# Patient Record
Sex: Male | Born: 1960 | Race: White | Hispanic: No | Marital: Married | Smoking: Never smoker
Health system: Southern US, Community
[De-identification: ages and names within clinical notes are randomized; demographics above are authoritative.]

## PROBLEM LIST (undated history)

## (undated) DIAGNOSIS — E785 Hyperlipidemia, unspecified: Secondary | ICD-10-CM

## (undated) DIAGNOSIS — I10 Essential (primary) hypertension: Secondary | ICD-10-CM

## (undated) HISTORY — PX: ROTATOR CUFF REPAIR: SHX139

## (undated) HISTORY — PX: CHOLECYSTECTOMY: SHX55

---

## 2004-06-20 ENCOUNTER — Ambulatory Visit: Payer: Self-pay | Admitting: Internal Medicine

## 2005-07-13 ENCOUNTER — Ambulatory Visit: Payer: Self-pay | Admitting: Internal Medicine

## 2005-07-14 ENCOUNTER — Ambulatory Visit: Payer: Self-pay | Admitting: Internal Medicine

## 2005-08-03 ENCOUNTER — Encounter: Admission: RE | Admit: 2005-08-03 | Discharge: 2005-09-20 | Payer: Self-pay | Admitting: Internal Medicine

## 2005-08-10 ENCOUNTER — Ambulatory Visit: Payer: Self-pay | Admitting: Gastroenterology

## 2005-09-08 ENCOUNTER — Ambulatory Visit: Payer: Self-pay | Admitting: Gastroenterology

## 2007-05-21 ENCOUNTER — Ambulatory Visit: Payer: Self-pay | Admitting: Internal Medicine

## 2007-05-21 DIAGNOSIS — E8881 Metabolic syndrome: Secondary | ICD-10-CM

## 2007-05-21 DIAGNOSIS — E785 Hyperlipidemia, unspecified: Secondary | ICD-10-CM

## 2007-05-22 ENCOUNTER — Ambulatory Visit: Payer: Self-pay | Admitting: Internal Medicine

## 2007-06-02 LAB — CONVERTED CEMR LAB
ALT: 29 units/L (ref 0–53)
Bilirubin, Direct: 0.1 mg/dL (ref 0.0–0.3)
Calcium: 9.4 mg/dL (ref 8.4–10.5)
Cholesterol: 152 mg/dL (ref 0–200)
Creatinine,U: 161.4 mg/dL
Eosinophils Absolute: 0.1 10*3/uL (ref 0.0–0.6)
Eosinophils Relative: 2 % (ref 0.0–5.0)
GFR calc Af Amer: 116 mL/min
GFR calc non Af Amer: 96 mL/min
Glucose, Bld: 105 mg/dL — ABNORMAL HIGH (ref 70–99)
HDL: 26 mg/dL — ABNORMAL LOW (ref >39.0)
Hgb A1c MFr Bld: 5.5 % (ref 4.6–6.0)
Lymphocytes Relative: 34.3 % (ref 12.0–46.0)
MCHC: 34.8 g/dL (ref 30.0–36.0)
MCV: 90 fL (ref 78.0–100.0)
Microalb Creat Ratio: 3.1 mg/g (ref 0.0–30.0)
Monocytes Relative: 9.2 % (ref 3.0–11.0)
Neutro Abs: 3 10*3/uL (ref 1.4–7.7)
Platelets: 316 10*3/uL (ref 150–400)
Sodium: 143 meq/L (ref 135–145)
Triglycerides: 170 mg/dL — ABNORMAL HIGH (ref 0–149)
WBC: 5.7 10*3/uL (ref 4.5–10.5)

## 2007-06-04 ENCOUNTER — Encounter (INDEPENDENT_AMBULATORY_CARE_PROVIDER_SITE_OTHER): Payer: Self-pay | Admitting: *Deleted

## 2009-09-21 ENCOUNTER — Ambulatory Visit: Payer: Self-pay | Admitting: Occupational Medicine

## 2010-06-08 ENCOUNTER — Ambulatory Visit: Payer: Self-pay | Admitting: Internal Medicine

## 2010-06-08 ENCOUNTER — Encounter: Payer: Self-pay | Admitting: Internal Medicine

## 2010-06-08 DIAGNOSIS — R5381 Other malaise: Secondary | ICD-10-CM | POA: Insufficient documentation

## 2010-06-08 DIAGNOSIS — R6882 Decreased libido: Secondary | ICD-10-CM | POA: Insufficient documentation

## 2010-06-08 DIAGNOSIS — I868 Varicose veins of other specified sites: Secondary | ICD-10-CM

## 2010-06-08 DIAGNOSIS — R21 Rash and other nonspecific skin eruption: Secondary | ICD-10-CM

## 2010-06-08 DIAGNOSIS — R5383 Other fatigue: Secondary | ICD-10-CM

## 2010-06-08 DIAGNOSIS — Z9189 Other specified personal risk factors, not elsewhere classified: Secondary | ICD-10-CM | POA: Insufficient documentation

## 2010-06-14 ENCOUNTER — Ambulatory Visit: Payer: Self-pay | Admitting: Internal Medicine

## 2010-06-20 LAB — CONVERTED CEMR LAB
AST: 23 units/L (ref 0–37)
Albumin: 3.7 g/dL (ref 3.5–5.2)
Alkaline Phosphatase: 44 units/L (ref 39–117)
Basophils Relative: 0.5 % (ref 0.0–3.0)
CO2: 30 meq/L (ref 19–32)
Chloride: 106 meq/L (ref 96–112)
Eosinophils Relative: 0 % (ref 0.0–5.0)
Glucose, Bld: 100 mg/dL — ABNORMAL HIGH (ref 70–99)
HCT: 41.9 % (ref 39.0–52.0)
Hgb A1c MFr Bld: 5.8 % (ref 4.6–6.5)
Ketones, ur: NEGATIVE mg/dL
Leukocytes, UA: NEGATIVE
Lymphs Abs: 2.1 10*3/uL (ref 0.7–4.0)
MCV: 92.8 fL (ref 78.0–100.0)
Monocytes Absolute: 0.5 10*3/uL (ref 0.1–1.0)
Monocytes Relative: 8.4 % (ref 3.0–12.0)
Neutrophils Relative %: 56.7 % (ref 43.0–77.0)
Nitrite: NEGATIVE
PSA: 2.1 ng/mL (ref 0.10–4.00)
Potassium: 4.8 meq/L (ref 3.5–5.1)
RBC: 4.52 M/uL (ref 4.22–5.81)
Sodium: 141 meq/L (ref 135–145)
Specific Gravity, Urine: 1.025 (ref 1.000–1.030)
Total Protein: 6.5 g/dL (ref 6.0–8.3)
WBC: 6.2 10*3/uL (ref 4.5–10.5)
pH: 5.5 (ref 5.0–8.0)

## 2010-06-21 ENCOUNTER — Telehealth: Payer: Self-pay | Admitting: Internal Medicine

## 2010-06-24 ENCOUNTER — Ambulatory Visit: Payer: Self-pay | Admitting: Internal Medicine

## 2010-06-27 LAB — CONVERTED CEMR LAB
Bilirubin Urine: NEGATIVE
Hemoglobin, Urine: NEGATIVE
Leukocytes, UA: NEGATIVE
Nitrite: NEGATIVE
Total Protein, Urine: NEGATIVE mg/dL
Urobilinogen, UA: 0.2 (ref 0.0–1.0)

## 2010-06-29 ENCOUNTER — Encounter: Payer: Self-pay | Admitting: Internal Medicine

## 2010-07-04 LAB — CONVERTED CEMR LAB: Testosterone: 193.93 ng/dL — ABNORMAL LOW (ref 250–890)

## 2010-09-06 NOTE — Progress Notes (Signed)
Summary: needs more explanation about labs  Phone Note Call from Patient Call back at Work Phone [phone] Call back at cell = [phone]   Caller: Spouse Summary of Call: called [phone]  to schedule fasting 8am lab per Dr Frederik Pear request (see note 11/14)---that is his home number---wife would like a nurse to call spouse at work number or cell and explain why another lab needs to be taken since he was fasting when the first labs wer taken; also wants nurse to explain numbers from first labs from 11/8; then he can make appointment Initial call taken by: Jerolyn Shin,  June 21, 2010 10:26 AM  Follow-up for Phone Call        Left message on voicemail to call back to office. Lucious Groves CMA  June 21, 2010 10:33 AM   Patient notified. Lucious Groves CMA  June 21, 2010 10:44 AM

## 2010-09-06 NOTE — Assessment & Plan Note (Signed)
Summary: STOMACH PAIN,VOMITTING/WB   Vital Signs:  Patient Profile:   50 Years Old Male CC:      Abdominal pain, blotting, vomiitng, diarrhea since 2 am this morning Height:     71 inches Weight:      265 pounds O2 Sat:      99 % O2 treatment:    Room Air Temp:     97.4 degrees F oral Pulse rate:   72 / minute Pulse rhythm:   regular Resp:     16 per minute BP sitting:   128 / 83  (right arm) Cuff size:   regular  Vitals Entered By: Emilio Math (September 21, 2009 8:39 AM)                  Current Allergies (reviewed today): No known allergies History of Present Illness Chief Complaint: Abdominal pain, blotting, vomiitng, diarrhea since 2 am this morning History of Present Illness: Presents with complaints of nausea, vomiting, and diarrhea since early this am.  No reports of fever.   No  other sick contacts.    Current Meds MULTIVITAMINS   TABS (MULTIPLE VITAMIN) 1 tablet by mouth daily FISH OIL   OIL (FISH OIL) 4 capsules by mouth daily ADULT ASPIRIN EC LOW STRENGTH 81 MG  TBEC (ASPIRIN) 1 tablet by mouth daily PRAVACHOL 40 MG  TABS (PRAVASTATIN SODIUM) 1 qhs ZOFRAN 4 MG TABS (ONDANSETRON HCL) 1 by mouth every 8 hours as needed for nausea  REVIEW OF SYSTEMS Constitutional Symptoms      Denies fever, chills, night sweats, weight loss, weight gain, and fatigue.  Eyes       Complains of glasses.      Denies change in vision, eye pain, eye discharge, contact lenses, and eye surgery. Ear/Nose/Throat/Mouth       Denies hearing loss/aids, change in hearing, ear pain, ear discharge, dizziness, frequent runny nose, frequent nose bleeds, sinus problems, sore throat, hoarseness, and tooth pain or bleeding.  Respiratory       Denies dry cough, productive cough, wheezing, shortness of breath, asthma, bronchitis, and emphysema/COPD.  Cardiovascular       Denies murmurs, chest pain, and tires easily with exhertion.    Gastrointestinal       Complains of stomach pain,  nausea/vomiting, and diarrhea.      Denies constipation, blood in bowel movements, and indigestion. Genitourniary       Denies painful urination, kidney stones, and loss of urinary control. Neurological       Denies paralysis, seizures, and fainting/blackouts. Musculoskeletal       Complains of muscle pain and joint pain.      Denies joint stiffness, decreased range of motion, redness, swelling, muscle weakness, and gout.  Skin       Denies bruising, unusual mles/lumps or sores, and hair/skin or nail changes.  Psych       Denies mood changes, temper/anger issues, anxiety/stress, speech problems, depression, and sleep problems.  Past History:  Past Medical History: Reviewed history from 05/21/2007 and no changes required. Borderline HTN Hyperlipidemia  Past Surgical History: Reviewed history from 05/21/2007 and no changes required. Colonoscopy 2007 int  hemorrhoids  Family History: Reviewed history from 05/21/2007 and no changes required. Family History Diabetes 1st degree relative Family History Hypertension Father: cns aneurysm Mother: uterine CA,DM,HTN, polyps Siblings: neg  Social History: Non smoker No ETOH No DRugs [employer] Physical Exam General appearance: well developed, well nourished, no acute distress Thyroid: no nodules, masses, tenderness,  or enlargement Chest/Lungs: no rales, wheezes, or rhonchi bilateral, breath sounds equal without effort Heart: regular rate and  rhythm, no murmur Abdomen: positive epigastric tenderness, abdomen soft without obvious organomegaly Assessment New Problems: GASTROENTERITIS (ICD-558.9)   Plan New Medications/Changes: ZOFRAN 4 MG TABS (ONDANSETRON HCL) 1 by mouth every 8 hours as needed for nausea  #15 x 0, 09/21/2009, Kathrine Haddock MD  New Orders: Est. Patient Level II [phone]  The patient and/or caregiver has been counseled thoroughly with regard to medications prescribed including dosage, schedule,  interactions, rationale for use, and possible side effects and they verbalize understanding.  Diagnoses and expected course of recovery discussed and will return if not improved as expected or if the condition worsens. Patient and/or caregiver verbalized understanding.  Prescriptions: ZOFRAN 4 MG TABS (ONDANSETRON HCL) 1 by mouth every 8 hours as needed for nausea  #15 x 0   Entered and Authorized by:   Kathrine Haddock MD   Signed by:   Kathrine Haddock MD on 09/21/2009   Method used:   Print then Give to Patient   RxID:   [id number]   Patient Instructions: 1)  teh main problem with gastroenteritis is dehydration. Drink plenty of fluids and take solids as you feel better. If you are unable to keep anything down and/or you show signs of dehydration(dry/cracked lips, lack of tears, not urinating, very sleepy), call our office. 2)  Zofran as needed for nausea

## 2010-09-06 NOTE — Assessment & Plan Note (Signed)
Summary: CPX,DISCUSS CHOLESTEROL,UHC/RH......   Vital Signs:  Patient profile:   50 year old male Height:      71 inches Weight:      279.4 pounds BMI:     39.11 Temp:     97.7 degrees F oral Pulse rate:   76 / minute Resp:     16 per minute BP sitting:   122 / 84  (left arm) Cuff size:   large  Vitals Entered By: Shonna Chock CMA (June 08, 2010 3:23 PM)   History of Present Illness: Timothy Wiggins is here for a physical; he has had some intermittent fatigue.  Lipid Management History:      Positive NCEP/ATP III risk factors include male age 67 years old or older and HDL cholesterol less than 40.  Negative NCEP/ATP III risk factors include non-diabetic, no family history for ischemic heart disease, non-tobacco-user status, non-hypertensive, no ASHD (atherosclerotic heart disease), no prior stroke/TIA, no peripheral vascular disease, and no history of aortic aneurysm.     Preventive Screening-Counseling & Management  Caffeine-Diet-Exercise     Does Patient Exercise: no  Current Medications (verified): 1)  Multivitamins   Tabs (Multiple Vitamin) .Marland Kitchen.. 1 Tablet By Mouth Daily 2)  Fish Oil   Oil (Fish Oil) .... 4 Capsules By Mouth Daily 3)  Adult Aspirin Ec Low Strength 81 Mg  Tbec (Aspirin) .Marland Kitchen.. 1 Tablet By Mouth Daily  Allergies (verified): No Known Drug Allergies  Past History:  Past Medical History: Elevated BP w/o diagnosis of  HTN Hyperlipidemia: Framingham Study LDL goal = < 130. 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         DOE only with stairs. Intermittent bitemporal headaches; NSAIDS releve these. Snoring  w/o history of apnea. Some daytime somnulence .No am headaches . Some decreased libido & suboptimal erections.  Physical Exam  General:  in no acute distress; alert,appropriate and cooperative throughout examination Head:  Normocephalic and atraumatic without obvious abnormalities. No apparent alopecia ; moustache Eyes:  No corneal or conjunctival inflammation noted. Marland Kitchen Perrla. Funduscopic exam benign, without hemorrhages, exudates or papilledema.  Ears:  External ear exam shows no significant lesions or deformities.  Otoscopic examination reveals clear canals, tympanic membranes are intact bilaterally without bulging, retraction, inflammation or discharge. Hearing is grossly normal bilaterally. Nose:  External nasal examination shows no deformity or inflammation. Nasal mucosa are pink and moist without lesions or exudates. Mouth:  Oral mucosa and oropharynx without lesions or exudates.  Teeth in good repair. Oropharyngeal marked crowding Neck:  No deformities, masses, or tenderness noted. Lungs:  Normal respiratory effort, chest expands symmetrically. Lungs are clear to auscultation, no crackles or wheezes. Heart:  Normal rate and regular rhythm. S1 and S2 normal without gallop, murmur, click, rub .S 4 Abdomen:  Bowel sounds positive,abdomen soft and non-tender without masses, organomegaly or hernias  noted. Rectal:  No external abnormalities noted. Normal sphincter tone. No rectal masses or tenderness. Genitalia:  Testes bilaterally descended without nodularity, tenderness or masses.  No scrotal masses or lesions. No penis lesions or urethral discharge. Prostate:  Prostate gland firm and smooth, no enlargement, nodularity, tenderness, mass, asymmetry or induration. Msk:  No deformity or scoliosis noted of thoracic or lumbar spine.   Pulses:  R and L carotid,radial,dorsalis pedis and posterior tibial pulses are full and equal bilaterally Extremities:  No clubbing, cyanosis, edema, or deformity noted with normal full range of motion of all joints.   Neurologic:  alert & oriented X3 and DTRs symmetrical and normal.   Skin:  Intact without suspicious lesions. Irregular reddish plaques over RLE , predominantly around large varicosity  Cervical Nodes:  No lymphadenopathy noted Axillary Nodes:  No palpable lymphadenopathy Psych:  memory intact for recent and remote, normally interactive, and good eye contact.     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 1 qhs  Problem # 6:  VARICOSE VEIN (ICD-456.8)  Problem # 7:  LIBIDO, DECREASED (ICD-799.81)  Problem # 8:  SNORING, HX OF (ICD-V15.89)  Orders: Sleep Disorder Referral (Sleep Disorder)  Complete Medication List: 1)  Multivitamins Tabs (Multiple vitamin) .Marland Kitchen.. 1 tablet by mouth daily 2)  Fish Oil Oil (Fish oil) .... 4 capsules by mouth daily 3)  Adult Aspirin Ec Low Strength 81 Mg Tbec (Aspirin) .Marland Kitchen.. 1 tablet by mouth daily  Other Orders: Tdap => 20yrs IM (04540) Admin 1st Vaccine (98119)  Lipid Assessment/Plan:      Based on  NCEP/ATP III, the patient's risk factor category is "2 or more risk factors and a calculated 10 year CAD risk of < 20%".  The patient's lipid goals are as follows: Total cholesterol goal is 200; LDL cholesterol goal is 130; HDL cholesterol goal is 40; Triglyceride goal is 150.  His LDL cholesterol goal has been met.    Patient Instructions: 1)  Consume as little HFCS sugar/ day as possible ( @ least < 40 grams/ day) .Consider The New Sugar Busters. Schedue fasting labs:Testosterone level;free T4;BMP Hepatic Panel ;Lipid Panel ;TSH ;CBC w/ Diff ;Urine-dip ;PSA ;HbgA1C . See Diagnoses for Codes.   Orders Added: 1)  Tdap => 35yrs IM [90715] 2)  Admin 1st Vaccine [90471] 3)  Est. Patient 40-64 years [99396] 4)  EKG w/ Interpretation [93000] 5)  Sleep Disorder Referral [Sleep Disorder]   Immunizations Administered:  Tetanus Vaccine:    Vaccine Type: Tdap    Site: right deltoid    Mfr: GlaxoSmithKline    Dose: 0.5 ml    Route: IM    Given by: Shonna Chock CMA    Exp. Date: 05/26/2012    Lot #: [id number]    VIS given: 06/24/08 version given June 08, 2010.   Immunizations Administered:  Tetanus Vaccine:    Vaccine Type: Tdap    Site: right deltoid    Mfr: GlaxoSmithKline    Dose: 0.5 ml    Route: IM    Given by: Chrae  Malloy CMA    Exp. Date: 05/26/2012    Lot #: [id number]    VIS given: 06/24/08 version given June 08, 2010.

## 2010-11-03 ENCOUNTER — Encounter: Payer: Self-pay | Admitting: Emergency Medicine

## 2010-11-03 ENCOUNTER — Inpatient Hospital Stay (INDEPENDENT_AMBULATORY_CARE_PROVIDER_SITE_OTHER)
Admission: RE | Admit: 2010-11-03 | Discharge: 2010-11-03 | Disposition: A | Discharge status: Home / Self Care | Payer: 59 | Source: Ambulatory Visit | Attending: Emergency Medicine | Admitting: Emergency Medicine

## 2010-11-03 DIAGNOSIS — M25569 Pain in unspecified knee: Secondary | ICD-10-CM | POA: Insufficient documentation

## 2010-11-04 ENCOUNTER — Telehealth (INDEPENDENT_AMBULATORY_CARE_PROVIDER_SITE_OTHER): Payer: Self-pay | Admitting: *Deleted

## 2010-11-08 NOTE — Assessment & Plan Note (Signed)
Summary: PAIN & STIFFNESS IN KNEES/TJ (rm 2)   Vital Signs:  Patient Profile:   50 Years Old Male CC:      knee pain x 1 week Height:     71 inches Weight:      284 pounds O2 Sat:      97 % O2 treatment:    Room Air Temp:     99.2 degrees F oral Pulse rate:   71 / minute Resp:     16 per minute BP sitting:   149 / 88  (left arm) Cuff size:   large  Pt. in pain?   yes    Location:   knee    Type:       aching/stiff  Vitals Entered By: Lajean Saver RN (November 03, 2010 7:23 PM)                   Updated Prior Medication List: MULTIVITAMINS   TABS (MULTIPLE VITAMIN) 1 tablet by mouth daily FISH OIL   OIL (FISH OIL) 4 capsules by mouth daily AXIRON 30 MG/ACT SOLN (TESTOSTERONE) apply 30 mg (one pump) to each underarm once daily  Current Allergies: No known allergies History of Present Illness History from: patient Chief Complaint: knee pain x 1 week History of Present Illness: Bilateral knee pain for 1 week.  Doesn't recall any trauma (falling, twisting).  His L knee may be slightly worse.  He doesn't know which knee started first.  Anterior knee pain, stiffness, and soreness. His father had bad OA.  Has been taking Aleve which helps.  Prior to this week, had mild problems, but this week feels worse.  No F/C or other systemic symptoms.  Worse after sitting awhile but tends to loosen up at he goes.  REVIEW OF SYSTEMS Constitutional Symptoms      Denies fever, chills, night sweats, weight loss, weight gain, and fatigue.  Eyes       Denies change in vision, eye pain, eye discharge, glasses, contact lenses, and eye surgery. Ear/Nose/Throat/Mouth       Denies hearing loss/aids, change in hearing, ear pain, ear discharge, dizziness, frequent runny nose, frequent nose bleeds, sinus problems, sore throat, hoarseness, and tooth pain or bleeding.  Respiratory       Denies dry cough, productive cough, wheezing, shortness of breath, asthma, bronchitis, and emphysema/COPD.    Cardiovascular       Denies murmurs, chest pain, and tires easily with exhertion.    Gastrointestinal       Denies stomach pain, nausea/vomiting, diarrhea, constipation, blood in bowel movements, and indigestion. Genitourniary       Denies painful urination, kidney stones, and loss of urinary control. Neurological       Denies paralysis, seizures, and fainting/blackouts. Musculoskeletal       Complains of joint pain and joint stiffness.      Denies muscle pain, decreased range of motion, redness, swelling, muscle weakness, and gout.      Comments: knees Skin       Denies bruising, unusual mles/lumps or sores, and hair/skin or nail changes.  Psych       Denies mood changes, temper/anger issues, anxiety/stress, speech problems, depression, and sleep problems. Other Comments: Patient c/o bilateral knee pain x 1 week without cause of injury. He c/o pain/stiffness when getting up from a seated position and when walking. No pain at rest taken Aleve for pain   Past History:  Past Medical History: Elevated BP w/o diagnosis of  HTN  Hyperlipidemia: Framingham Study LDL goal = < 130. off meds Pneumonia age 79  Past Surgical History: Reviewed history from 06/08/2010 and no changes required. Colonoscopy 2007 internal   hemorrhoids Rotator cuff repair R, 2010, Dr Marlyne Beards, [facility] , [place]  Family History: Reviewed history from 06/08/2010 and no changes required. Father: cns aneurysm Mother: uterine  cancer,DM,HTN, colon polyps, glaucoma Siblings: negative; P aunt : high lipids; P uncle : DM, lipids, CBAG  Social History: Reviewed history from 06/08/2010 and no changes required. 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  Patella freely mobile but with crepitus.  TTP medial joint line bilaterally (L>R), no lateral joint tenderness. Assessment New Problems: KNEE PAIN (ICD-719.46)   Plan New Medications/Changes: NAPROXEN 500 MG TABS (NAPROXEN) 1 by mouth two times a day for 2 weeks  #28 x 0, 11/03/2010, Hoyt Koch MD  New Orders: Services provided After hours-Weekends-Holidays [99051] Est. Patient Level IV [87564] Planning Comments:   Rx for Naproxen to take BID for the next week.  Encourage intermittant cold compresses.  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  May have damaged both at once.  I did not obtain Xrays since I'm sending him to Dr. Margaretha Sheffield next week who can do better Xrays (standing and sunrise).  He has lots of options including weight loss, PT, NSAIDs, cortisone or viscosupplementation shots, and braces.  All these discussed with patient.  Can consider MRI but should only be for failed conservative treatment or surgical planning.  Patient understands. We will need to call tomorrow and make appt for him next Friday in [place] (he will be out of town until then).  If worsening in the meantime, seek medical care.   The patient and/or caregiver has been counseled thoroughly with regard to medications prescribed including dosage, schedule, interactions, rationale for use, and possible side effects and they verbalize understanding.  Diagnoses and expected course of recovery discussed and will return if not improved as expected or if the condition worsens. Patient and/or caregiver verbalized understanding.  Prescriptions: NAPROXEN 500 MG TABS (NAPROXEN) 1 by mouth two times a day for 2 weeks  #28 x 0   Entered and Authorized by:   Hoyt Koch MD   Signed by:   Hoyt Koch MD on 11/03/2010   Method used:   Print then Give to  Patient   RxID:   [id number]   Orders Added: 1)  Services provided After hours-Weekends-Holidays [99051] 2)  Est. Patient Level IV [16010]

## 2010-11-08 NOTE — Progress Notes (Signed)
  Phone Note Outgoing Call   Call placed by: Clemens Catholic LPN,  November 04, 2010 3:11 PM Call placed to: Patients wife Summary of Call: appt sch'ed for the pt with dr draper @ the [facility] office on April 9,2012 @ 9:00AM, arrive @ 8:45AM. pts wife notified. records faxed. Initial call taken by: Clemens Catholic LPN,  November 04, 2010 3:12 PM

## 2011-07-29 ENCOUNTER — Emergency Department
Admission: EM | Admit: 2011-07-29 | Discharge: 2011-07-29 | Disposition: A | Discharge status: Home / Self Care | Payer: 59 | Source: Home / Self Care | Attending: Emergency Medicine | Admitting: Emergency Medicine

## 2011-07-29 ENCOUNTER — Encounter: Payer: Self-pay | Admitting: Emergency Medicine

## 2011-07-29 DIAGNOSIS — H6690 Otitis media, unspecified, unspecified ear: Secondary | ICD-10-CM

## 2011-07-29 DIAGNOSIS — H6691 Otitis media, unspecified, right ear: Secondary | ICD-10-CM

## 2011-07-29 DIAGNOSIS — H9209 Otalgia, unspecified ear: Secondary | ICD-10-CM

## 2011-07-29 HISTORY — DX: Hyperlipidemia, unspecified: E78.5

## 2011-07-29 MED ORDER — FLUTICASONE PROPIONATE 50 MCG/ACT NA SUSP: 2.0000 | Freq: Every day | NASAL | Status: DC

## 2011-07-29 MED ORDER — AMOXICILLIN-POT CLAVULANATE 875-125 MG PO TABS: 1.0000 | ORAL_TABLET | Freq: Two times a day (BID) | ORAL | Status: AC

## 2011-07-29 NOTE — ED Provider Notes (Signed)
History     CSN: [id number]  Arrival date & time 07/29/11  1035   First MD Initiated Contact with Patient 07/29/11 1148      Chief Complaint  Patient presents with  . Otalgia    (Consider location/radiation/quality/duration/timing/severity/associated sxs/prior treatment) HPI Masson is a 50 y.o. male who complains of onset of cold symptoms for a few days. He has had some eustachian tube dysfunction in the past and has been prescribed Flonase which tends to work well for him No sore throat No cough No pleuritic pain No wheezing + nasal congestion + post-nasal drainage +sinus pain/pressure No chest congestion No itchy/red eyes + R earache No hemoptysis No SOB No chills/sweats No fever No nausea No vomiting No abdominal pain No diarrhea No skin rashes No fatigue No myalgias No headache    Past Medical History  Diagnosis Date  . Hyperlipemia     Past Surgical History  Procedure Date  . Rotator cuff repair     No family history on file.  History  Substance Use Topics  . Smoking status: Never Smoker   . Smokeless tobacco: Not on file  . Alcohol Use: No      Review of Systems  Allergies  Review of patient's allergies indicates no known allergies.  Home Medications  No current outpatient prescriptions on file.  BP 129/87  Pulse 79  Temp 98.2 F (36.8 C)  Resp 16  Ht 5\' 11"  (1.803 m)  Wt 280 lb (127.007 kg)  BMI 39.05 kg/m2  SpO2 97%  Physical Exam  Nursing note and vitals reviewed. Constitutional: He is oriented to person, place, and time. He appears well-developed and well-nourished.  HENT:  Head: Normocephalic and atraumatic.  Right Ear: External ear and ear canal normal. Tympanic membrane is erythematous and bulging.  Left Ear: Tympanic membrane, external ear and ear canal normal.  Nose: Mucosal edema and rhinorrhea present.  Mouth/Throat: Posterior oropharyngeal erythema present. No oropharyngeal exudate or posterior oropharyngeal  edema.  Eyes: No scleral icterus.  Neck: Neck supple.  Cardiovascular: Regular rhythm and normal heart sounds.   Pulmonary/Chest: Effort normal and breath sounds normal. No respiratory distress.  Neurological: He is alert and oriented to person, place, and time.  Skin: Skin is warm and dry.  Psychiatric: He has a normal mood and affect. His speech is normal.    ED Course  Procedures (including critical care time)  Labs Reviewed - No data to display No results found.   No diagnosis found.    MDM  1)  Take the prescribed antibiotic as instructed. 2)  Use nasal saline solution (over the counter) at least 3 times a day. 3)  Use over the counter decongestants like Zyrtec-D every 12 hours as needed to help with congestion.  If you have hypertension, do not take medicines with sudafed.  4)  Can take tylenol every 6 hours or motrin every 8 hours for pain or fever. 5)  Follow up with your primary doctor if no improvement in 5-7 days, sooner if increasing pain, fever, or new symptoms.     Lily Kocher, MD 07/29/11 1149

## 2011-07-29 NOTE — ED Notes (Signed)
Right ear pain x 4 days; congestion. Did have flu vaccine this season,

## 2013-01-05 ENCOUNTER — Emergency Department
Admission: EM | Admit: 2013-01-05 | Discharge: 2013-01-05 | Disposition: A | Discharge status: Home / Self Care | Payer: 59 | Source: Home / Self Care | Attending: Family Medicine | Admitting: Family Medicine

## 2013-01-05 ENCOUNTER — Emergency Department (INDEPENDENT_AMBULATORY_CARE_PROVIDER_SITE_OTHER): Payer: 59

## 2013-01-05 ENCOUNTER — Encounter: Payer: Self-pay | Admitting: Emergency Medicine

## 2013-01-05 DIAGNOSIS — M25579 Pain in unspecified ankle and joints of unspecified foot: Secondary | ICD-10-CM

## 2013-01-05 DIAGNOSIS — S92301A Fracture of unspecified metatarsal bone(s), right foot, initial encounter for closed fracture: Secondary | ICD-10-CM

## 2013-01-05 DIAGNOSIS — S93401A Sprain of unspecified ligament of right ankle, initial encounter: Secondary | ICD-10-CM

## 2013-01-05 DIAGNOSIS — S92309A Fracture of unspecified metatarsal bone(s), unspecified foot, initial encounter for closed fracture: Secondary | ICD-10-CM

## 2013-01-05 DIAGNOSIS — S93409A Sprain of unspecified ligament of unspecified ankle, initial encounter: Secondary | ICD-10-CM

## 2013-01-05 DIAGNOSIS — W108XXA Fall (on) (from) other stairs and steps, initial encounter: Secondary | ICD-10-CM

## 2013-01-05 MED ORDER — HYDROCODONE-ACETAMINOPHEN 5-325 MG PO TABS: ORAL_TABLET | ORAL | Status: DC

## 2013-01-05 NOTE — ED Provider Notes (Signed)
History     CSN: [id number]  Arrival date & time 01/05/13  1204   First MD Initiated Contact with Patient 01/05/13 1458      Chief Complaint  Patient presents with  . Ankle Injury  . Foot Injury      HPI Comments: Patient was walking down his deck stairs yesterday evening when he slipped on the last two stairs and twisted his right ankle/foot.  He heard a popping sound with immediate onset of pain in his ankle/foot.  He states that he is unable to bear weight.  Patient is a 52 y.o. male presenting with ankle pain. The history is provided by the patient and the spouse.  Ankle Pain Location:  Ankle and foot Time since incident:  1 day Injury: yes   Mechanism of injury: fall   Fall:    Fall occurred:  Down stairs Ankle location:  R ankle Foot location:  R foot Pain details:    Quality:  Throbbing and sharp   Radiates to:  Does not radiate   Severity:  Severe   Onset quality:  Sudden   Duration:  1 day   Timing:  Constant   Progression:  Unchanged Chronicity:  New Dislocation: no   Prior injury to area:  No Relieved by:  Nothing Worsened by:  Bearing weight Ineffective treatments:  NSAIDs Associated symptoms: decreased ROM, stiffness and swelling   Associated symptoms: no back pain, no muscle weakness, no numbness and no tingling   Risk factors: obesity     Past Medical History  Diagnosis Date  . Hyperlipemia     Past Surgical History  Procedure Laterality Date  . Rotator cuff repair      History reviewed. No pertinent family history.  History  Substance Use Topics  . Smoking status: Never Smoker   . Smokeless tobacco: Not on file  . Alcohol Use: No      Review of Systems  Musculoskeletal: Positive for stiffness. Negative for back pain.  All other systems reviewed and are negative.    Allergies  Review of patient's allergies indicates no known allergies.  Home Medications   Current Outpatient Rx  Name  Route  Sig  Dispense  Refill  . EXPIRED:  fluticasone (FLONASE) 50 MCG/ACT nasal spray   Nasal   Place 2 sprays into the nose daily.   16 g   1   . HYDROcodone-acetaminophen (NORCO/VICODIN) 5-325 MG per tablet      Take one by mouth at bedtime as needed for pain   10 tablet   0     BP 153/89  Temp(Src) 97.8 F (36.6 C) (Oral)  Resp 6  Ht 5\' 10"  (1.778 m)  Wt 290 lb (131.543 kg)  BMI 41.61 kg/m2  SpO2 99%  Physical Exam  Nursing note and vitals reviewed. Constitutional: He is oriented to person, place, and time. He appears well-developed and well-nourished. No distress.  Patient is obese (BMI 41.6)  HENT:  Head: Atraumatic.  Eyes: Conjunctivae are normal. Pupils are equal, round, and reactive to light.  Musculoskeletal: He exhibits tenderness.       Left ankle: He exhibits decreased range of motion and swelling. He exhibits no ecchymosis, no deformity, no laceration and normal pulse. Tenderness. Lateral malleolus, medial malleolus, AITFL and proximal fibula tenderness found. No CF ligament, no posterior TFL and no head of 5th metatarsal tenderness found. Achilles tendon normal.       Feet:  Right ankle reveals tenderness/swelling over both medial and  lateral malleoli, worse laterally.  There is also tenderness over the 5th metatarsal mid-shaft.  Neurological: He is alert and oriented to person, place, and time.  Skin: Skin is warm and dry. No erythema.    ED Course  Procedures  none   Dg Ankle Complete Right  01/05/2013   *RADIOLOGY REPORT*  Clinical Data: Pain post twisting injury  RIGHT ANKLE - COMPLETE 3+ VIEW  Comparison: None.  Findings: Oblique fracture of the fifth metatarsal   partially seen. No other fracture evident.  Ankle mortise intact.  Normal mineralization and alignment.  Small calcaneal spur.  Corticated ossicles at the dorsal margin of the distal talus.  IMPRESSION: 1. Negative ankle. 2.  Fifth metatarsal fracture, incompletely visualized   Original Report Authenticated By: D. Andria Rhein, MD    Dg Foot Complete Right  01/05/2013   *RADIOLOGY REPORT*  Clinical Data: Pain post inversion injury  RIGHT FOOT COMPLETE - 3+ VIEW  Comparison: None.  Findings: Oblique fracture of the mid shaft fifth metatarsal, distracted 1 mm.  No intra-articular involvement or significant angulation.  Mild hallux valgus deformity.  No other acute bone injury.  Mild sclerosis and flattening of the second metatarsal head.  Small calcaneal spur.  IMPRESSION:  1.  Minimally-displaced oblique fracture, midshaft right fifth metatarsal.   Original Report Authenticated By: D. Andria Rhein, MD     1. Right ankle sprain, initial encounter   2. Fracture of 5th metatarsal, right, closed, initial encounter       MDM  Ace wrap applied.  Dispensed crutches.  Rx for Lortab at bedtime. Apply ice pack for 30 minutes every 1 to 2 hours today and tomorrow.  Elevate.  Use crutches for 5 to 7 days.  Wear Ace wrap until swelling decreases.  Take adequate dose of vitamin D and calcium.  Take Tylenol daytime for pain Followup with Dr. Rodney Langton in about two days.        Lattie Haw, MD 01/05/13 [phone]

## 2013-01-05 NOTE — ED Notes (Signed)
Patient fell down 2 steps last night and landed in twisting motion on right foot/ankle.

## 2013-01-06 ENCOUNTER — Telehealth: Payer: Self-pay | Admitting: *Deleted

## 2013-01-07 ENCOUNTER — Encounter: Payer: Self-pay | Admitting: Sports Medicine

## 2013-01-07 ENCOUNTER — Ambulatory Visit (INDEPENDENT_AMBULATORY_CARE_PROVIDER_SITE_OTHER): Payer: 59 | Admitting: Sports Medicine

## 2013-01-07 VITALS — BP 145/88 | HR 74

## 2013-01-07 DIAGNOSIS — S92309A Fracture of unspecified metatarsal bone(s), unspecified foot, initial encounter for closed fracture: Secondary | ICD-10-CM

## 2013-01-07 DIAGNOSIS — S92351A Displaced fracture of fifth metatarsal bone, right foot, initial encounter for closed fracture: Secondary | ICD-10-CM

## 2013-01-07 DIAGNOSIS — S92355A Nondisplaced fracture of fifth metatarsal bone, left foot, initial encounter for closed fracture: Secondary | ICD-10-CM

## 2013-01-07 DIAGNOSIS — S92354A Nondisplaced fracture of fifth metatarsal bone, right foot, initial encounter for closed fracture: Secondary | ICD-10-CM | POA: Insufficient documentation

## 2013-01-07 MED ORDER — HYDROCODONE-ACETAMINOPHEN 5-325 MG PO TABS: 1.0000 | ORAL_TABLET | Freq: Three times a day (TID) | ORAL | Status: DC | PRN

## 2013-01-07 NOTE — Assessment & Plan Note (Signed)
Wrap with Ace bandage. Cam Boot. Hydrocodone for pain. Return in 2 weeks, x-ray before visit.  I billed a fracture code for this visit, all subsequent visits for this complaint will be "post-op checks" in the global period.

## 2013-01-07 NOTE — Progress Notes (Signed)
   Subjective:    I'm seeing this patient as a consultation for:  Dr. Cathren Harsh  CC: Right foot pain  HPI: This is a very pleasant 52 year old male who unfortunately 3 days ago slipped inverting his right foot. He heard a pop and had immediate swelling, pain, bruising. He was seen in urgent care where x-rays showed a spiral fracture to the fifth metatarsal bone, foot was Ace wrap, he was placed on crutches, and given some pain medication. He was referred to me for definitive evaluation and treatment. Currently pain is localized over the fifth metatarsal bone, tip of the medial malleolus, and the tip of the lateral malleolus, localized, without radiation. There is significant bruising. Particularly with weightbearing. Pain is severe.  Past medical history, Surgical history, Family history not pertinant except as noted below, Social history, Allergies, and medications have been entered into the medical record, reviewed, and no changes needed.   Review of Systems: No headache, visual changes, nausea, vomiting, diarrhea, constipation, dizziness, abdominal pain, skin rash, fevers, chills, night sweats, weight loss, swollen lymph nodes, body aches, joint swelling, muscle aches, chest pain, shortness of breath, mood changes, visual or auditory hallucinations.   Objective:   General: Well Developed, well nourished, and in no acute distress.  Neuro/Psych: Alert and oriented x3, extra-ocular muscles intact, able to move all 4 extremities, sensation grossly intact. Skin: Warm and dry, no rashes noted.  Respiratory: Not using accessory muscles, speaking in full sentences, trachea midline.  Cardiovascular: Pulses palpable, no extremity edema. Abdomen: Does not appear distended. Right foot: Swollen, bruised, tender to palpation of the fifth metatarsal, medial malleolus tip, malleolus tip, good motion, and is neurovascularly intact distally.  X-rays were reviewed, there is a spiral fracture that is non-angulated  nondisplaced through the shaft of the fifth metatarsal bone on the right side. There is a well corticated probably old avulsion from the tip of the lateral malleolus.  Impression and Recommendations:   This case required medical decision making of moderate complexity.

## 2013-01-08 ENCOUNTER — Ambulatory Visit: Payer: 59 | Admitting: Sports Medicine

## 2013-01-14 ENCOUNTER — Encounter: Payer: Self-pay | Admitting: Sports Medicine

## 2013-01-14 DIAGNOSIS — Z0289 Encounter for other administrative examinations: Secondary | ICD-10-CM

## 2013-01-14 NOTE — Progress Notes (Signed)
Handicap permit filled out, charge sheet filled out as well.

## 2013-01-20 ENCOUNTER — Ambulatory Visit (HOSPITAL_BASED_OUTPATIENT_CLINIC_OR_DEPARTMENT_OTHER)
Admission: RE | Admit: 2013-01-20 | Discharge: 2013-01-20 | Disposition: A | Discharge status: Home / Self Care | Payer: 59 | Source: Ambulatory Visit | Attending: Sports Medicine | Admitting: Sports Medicine

## 2013-01-20 DIAGNOSIS — S92351A Displaced fracture of fifth metatarsal bone, right foot, initial encounter for closed fracture: Secondary | ICD-10-CM

## 2013-01-20 DIAGNOSIS — M2140 Flat foot [pes planus] (acquired), unspecified foot: Secondary | ICD-10-CM | POA: Insufficient documentation

## 2013-01-20 DIAGNOSIS — X58XXXA Exposure to other specified factors, initial encounter: Secondary | ICD-10-CM | POA: Insufficient documentation

## 2013-01-20 DIAGNOSIS — S92309A Fracture of unspecified metatarsal bone(s), unspecified foot, initial encounter for closed fracture: Secondary | ICD-10-CM | POA: Insufficient documentation

## 2013-01-21 ENCOUNTER — Ambulatory Visit (INDEPENDENT_AMBULATORY_CARE_PROVIDER_SITE_OTHER): Payer: 59 | Admitting: Sports Medicine

## 2013-01-21 ENCOUNTER — Encounter: Payer: Self-pay | Admitting: Sports Medicine

## 2013-01-21 VITALS — BP 136/75 | HR 76

## 2013-01-21 DIAGNOSIS — S92354D Nondisplaced fracture of fifth metatarsal bone, right foot, subsequent encounter for fracture with routine healing: Secondary | ICD-10-CM

## 2013-01-21 DIAGNOSIS — S8290XD Unspecified fracture of unspecified lower leg, subsequent encounter for closed fracture with routine healing: Secondary | ICD-10-CM

## 2013-01-21 NOTE — Progress Notes (Signed)
  Subjective: Triton returns, approximately 2 weeks status post fracture of the right fifth metatarsal, for the initial time. He had been fairly noncompliant with mobilization and was walking around the tissue. He returns today having been in the [facility] for approximately 2 weeks, pain is significantly improved.   Objective: General: Well-developed, well-nourished, and in no acute distress. Still somewhat tender to palpation over the fracture line.  X-rays were reviewed, there is only minimal increased angulation of the fracture.  Assessment/plan:

## 2013-01-21 NOTE — Assessment & Plan Note (Signed)
Proximally 2 weeks status post fifth metatarsal shaft fracture. Slight increased angulation. Strapped with compressive dressing, increase time in good. Return in 2 weeks, x-ray before visit.

## 2013-02-03 ENCOUNTER — Ambulatory Visit (HOSPITAL_BASED_OUTPATIENT_CLINIC_OR_DEPARTMENT_OTHER)
Admission: RE | Admit: 2013-02-03 | Discharge: 2013-02-03 | Disposition: A | Discharge status: Home / Self Care | Payer: 59 | Source: Ambulatory Visit | Attending: Sports Medicine | Admitting: Sports Medicine

## 2013-02-03 DIAGNOSIS — S92354D Nondisplaced fracture of fifth metatarsal bone, right foot, subsequent encounter for fracture with routine healing: Secondary | ICD-10-CM

## 2013-02-03 DIAGNOSIS — M7989 Other specified soft tissue disorders: Secondary | ICD-10-CM | POA: Insufficient documentation

## 2013-02-03 DIAGNOSIS — Z4789 Encounter for other orthopedic aftercare: Secondary | ICD-10-CM | POA: Insufficient documentation

## 2013-02-04 ENCOUNTER — Ambulatory Visit (INDEPENDENT_AMBULATORY_CARE_PROVIDER_SITE_OTHER): Payer: 59 | Admitting: Sports Medicine

## 2013-02-04 ENCOUNTER — Encounter: Payer: Self-pay | Admitting: Sports Medicine

## 2013-02-04 VITALS — BP 134/78 | HR 69 | Wt 291.0 lb

## 2013-02-04 DIAGNOSIS — S8290XD Unspecified fracture of unspecified lower leg, subsequent encounter for closed fracture with routine healing: Secondary | ICD-10-CM

## 2013-02-04 DIAGNOSIS — S92354D Nondisplaced fracture of fifth metatarsal bone, right foot, subsequent encounter for fracture with routine healing: Secondary | ICD-10-CM

## 2013-02-04 NOTE — Progress Notes (Signed)
  Subjective: 4 weeks status post minimally angulated fracture of the right fifth metatarsal, pain-free, no pain with ambulation and weightbearing.   Objective: General: Well-developed, well-nourished, and in no acute distress. Boot is removed, there's no tenderness over the fracture site.  X-rays reviewed, there's been no further displacement or angulation, there is also good signs of bony callus formation.  Assessment/plan:

## 2013-02-04 NOTE — Assessment & Plan Note (Signed)
Crutches, but into a rigid soled shoe. May weight-bear as tolerated, if develops pain needs to go back into the boot. Return in 2 weeks for reevaluation and likely clearance.

## 2013-02-19 ENCOUNTER — Ambulatory Visit (INDEPENDENT_AMBULATORY_CARE_PROVIDER_SITE_OTHER): Payer: 59 | Admitting: Sports Medicine

## 2013-02-19 ENCOUNTER — Encounter: Payer: Self-pay | Admitting: Sports Medicine

## 2013-02-19 VITALS — BP 137/83 | HR 66 | Wt 293.0 lb

## 2013-02-19 DIAGNOSIS — S8290XD Unspecified fracture of unspecified lower leg, subsequent encounter for closed fracture with routine healing: Secondary | ICD-10-CM

## 2013-02-19 DIAGNOSIS — S92354D Nondisplaced fracture of fifth metatarsal bone, right foot, subsequent encounter for fracture with routine healing: Secondary | ICD-10-CM

## 2013-02-19 NOTE — Progress Notes (Signed)
  Subjective: 6 weeks status post fracture of the right fifth metatarsal shaft. Overall pain-free, still has a little bit of limp, and some pain in the arch.   Objective: General: Well-developed, well-nourished, and in no acute distress. No pain at the fracture site, pes planus bilaterally.  Assessment/plan:

## 2013-02-19 NOTE — Assessment & Plan Note (Signed)
Clinically healed, still has some pain in the arch likely due to a change in his gait. He still has pain he should come back in 2 weeks elective building custom orthotics. He does have a significant bunion, I will likely place a first metatarsal ray post.

## 2014-07-22 IMAGING — CR DG FOOT COMPLETE 3+V*R*
3 series · 3 of 3 positions shown · non-contrast
Comparison: None.

CLINICAL DATA: Pain post inversion injury

RIGHT FOOT COMPLETE - 3+ VIEW

[view not recorded (1 of 3)]
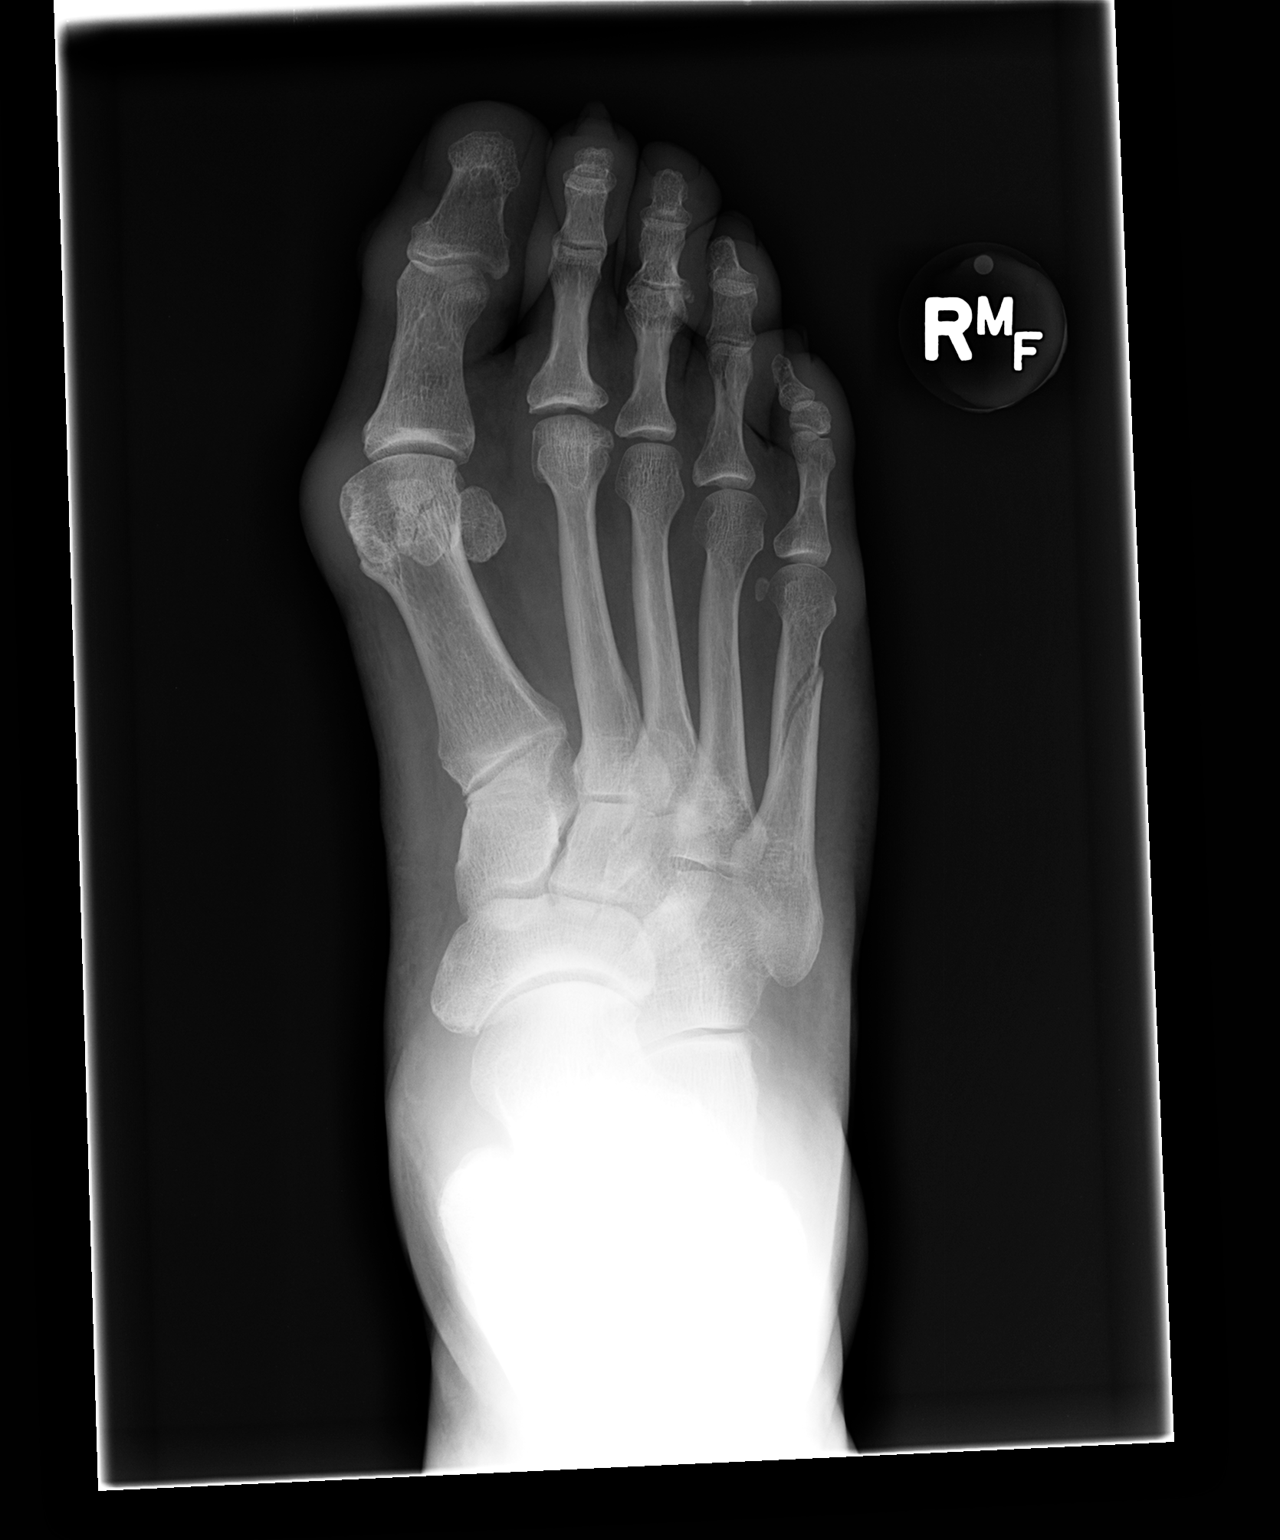

[view not recorded (2 of 3)]
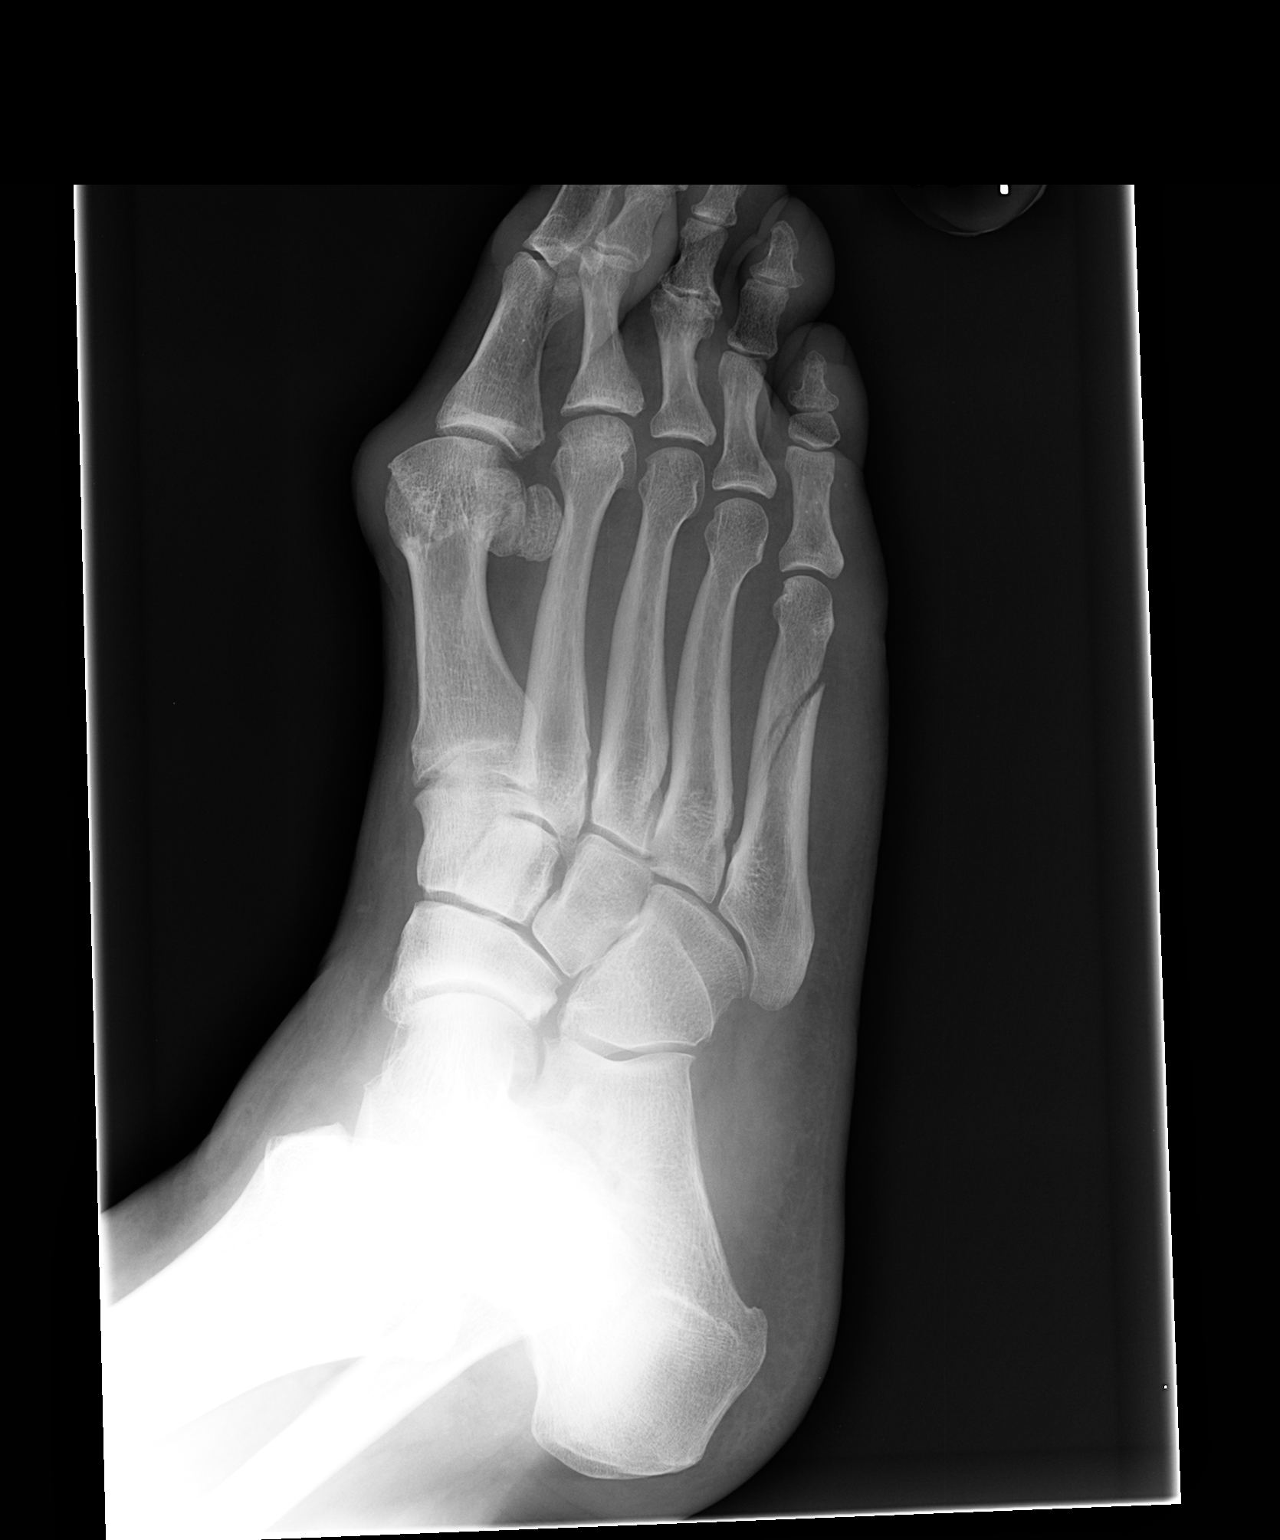

[view not recorded (3 of 3)]
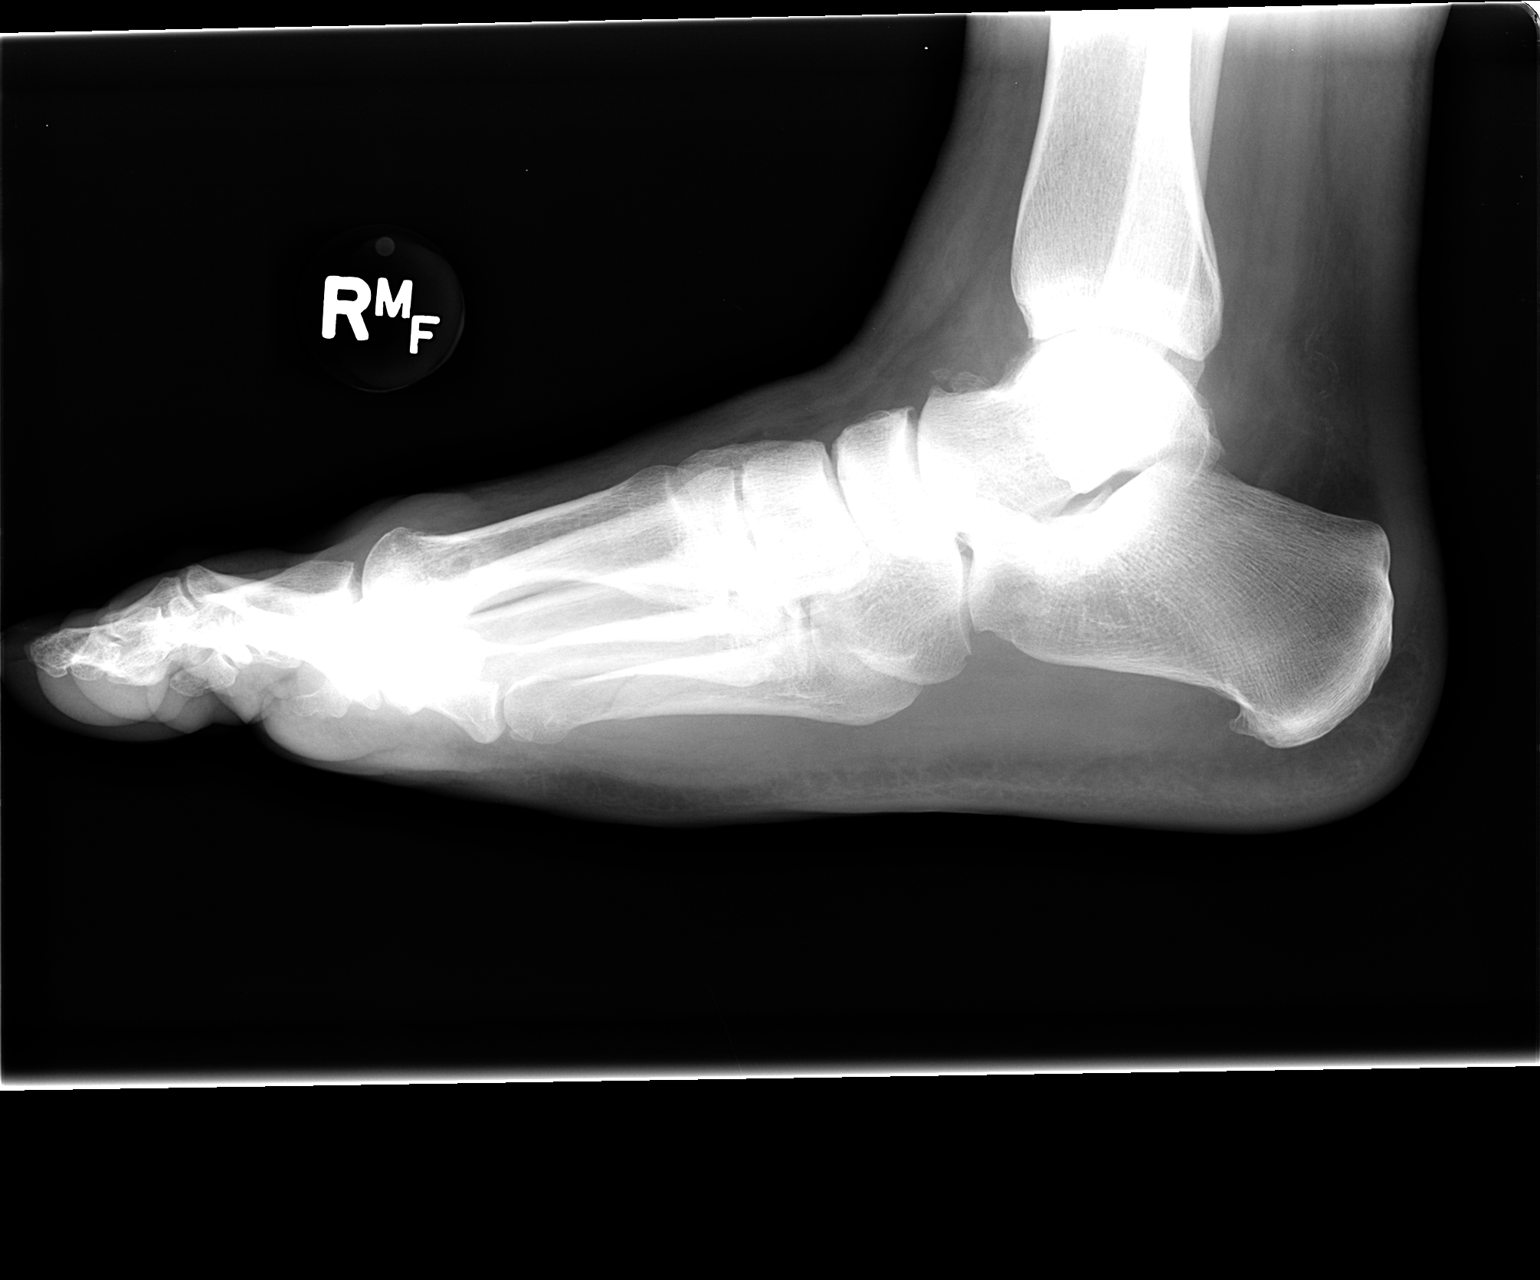

[3 of 3 positions shown; findings below may reference images not displayed]

FINDINGS: Oblique fracture of the mid shaft fifth metatarsal,
distracted 1 mm.  No intra-articular involvement or significant
angulation.  Mild hallux valgus deformity.  No other acute bone
injury.  Mild sclerosis and flattening of the second metatarsal
head.  Small calcaneal spur.
IMPRESSION: 1.  Minimally-displaced oblique fracture, midshaft right fifth
metatarsal.

## 2014-07-22 IMAGING — CR DG ANKLE COMPLETE 3+V*R*
3 series · 3 of 3 positions shown · non-contrast
Comparison: None.

CLINICAL DATA: Pain post twisting injury

RIGHT ANKLE - COMPLETE 3+ VIEW

[view not recorded (1 of 3)]
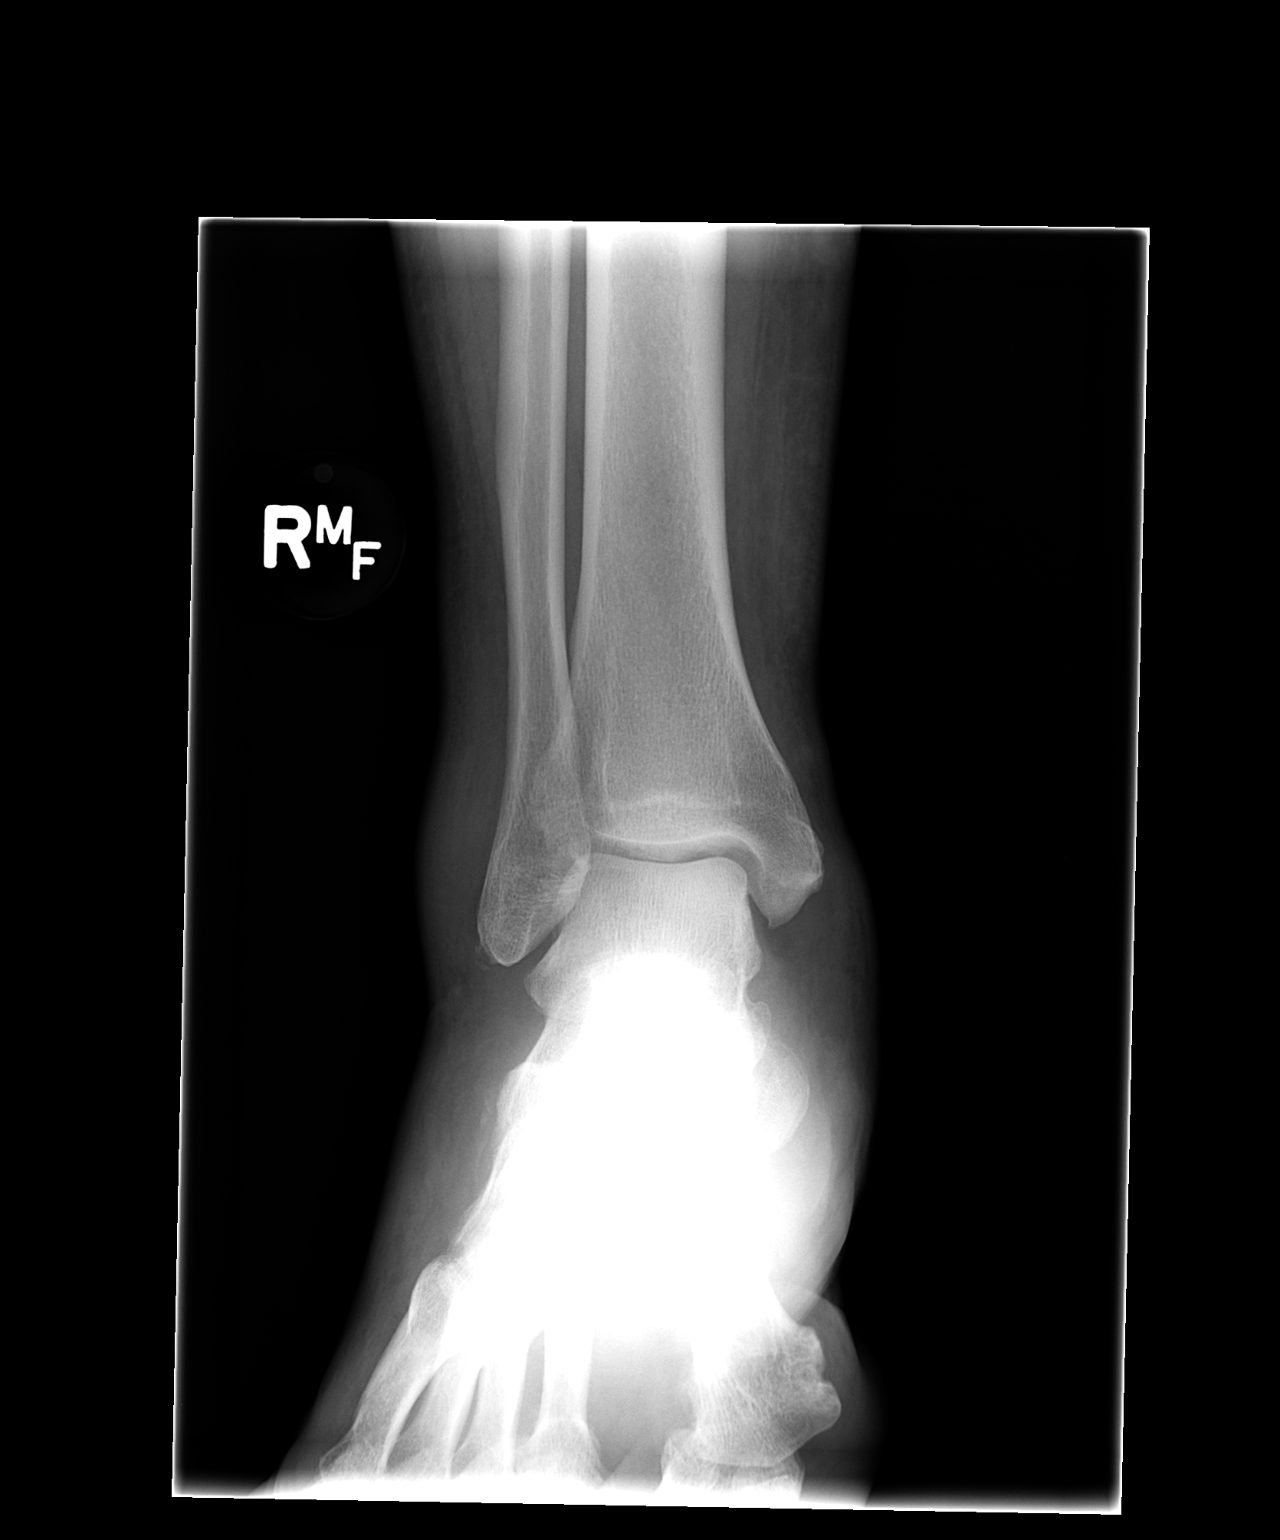

[view not recorded (2 of 3)]
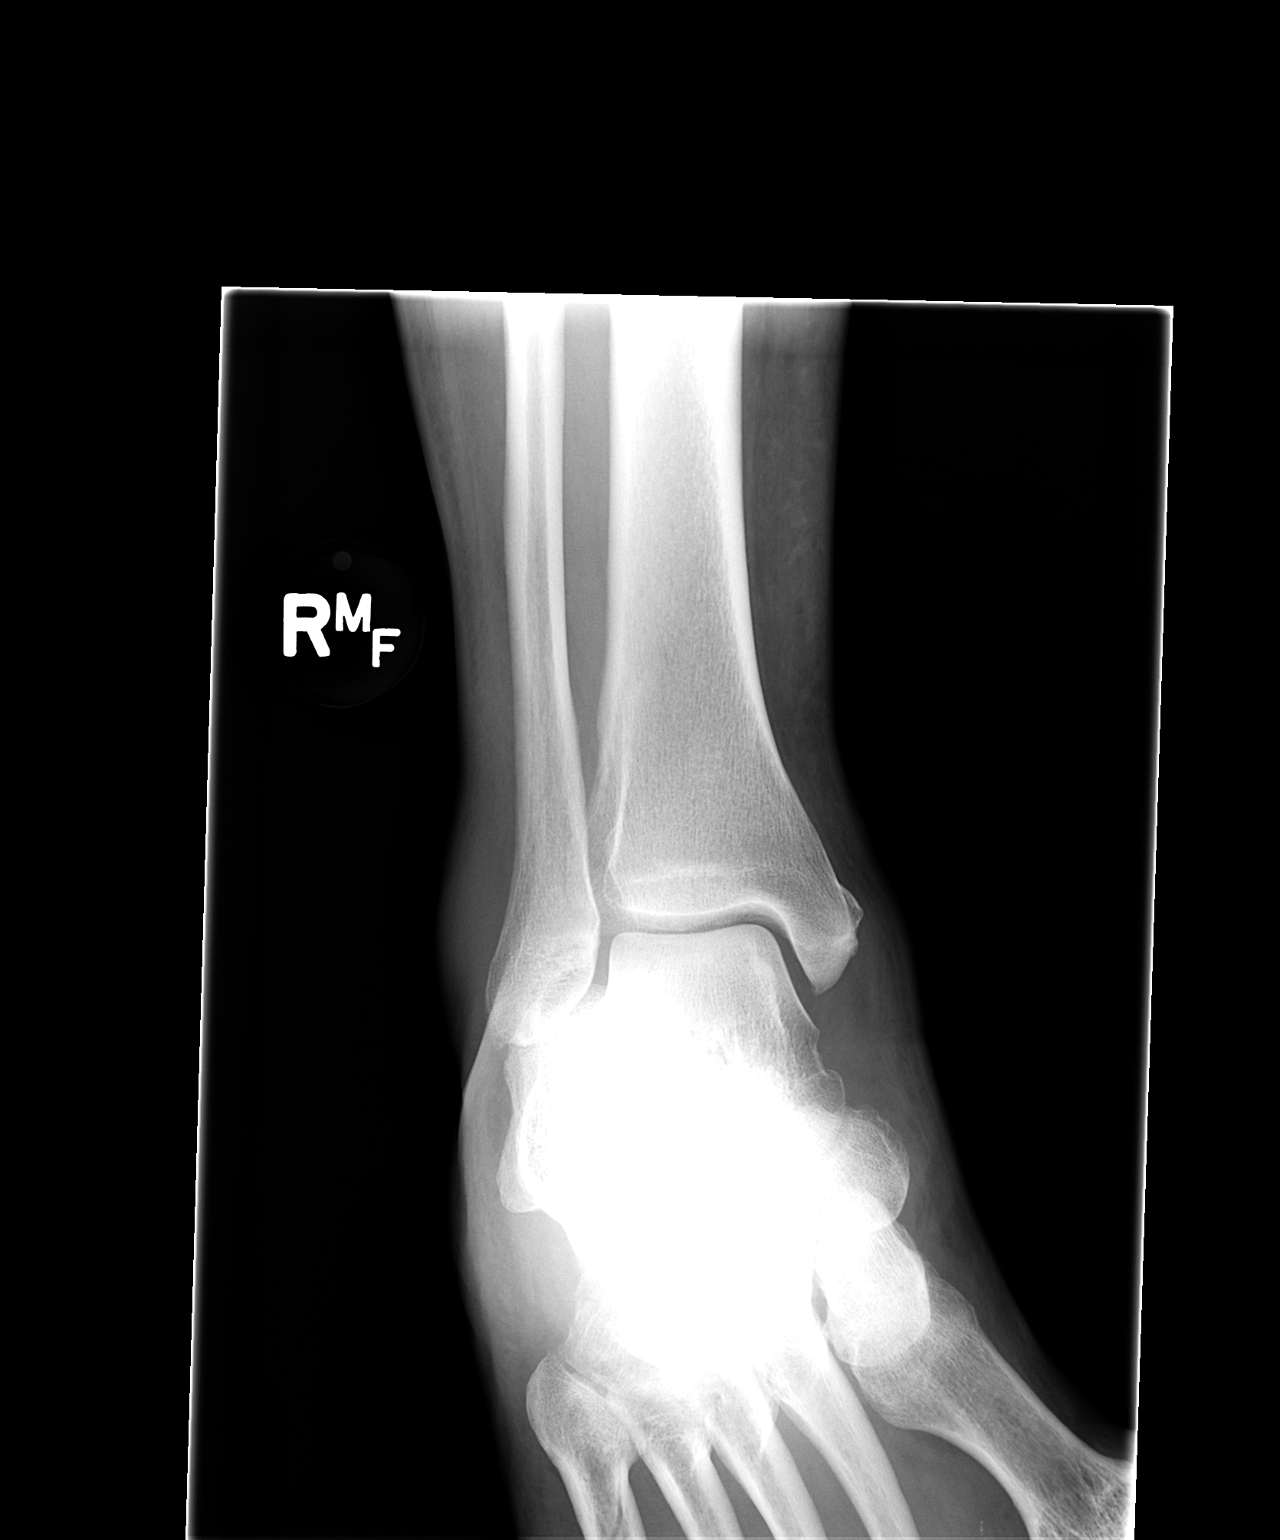

[view not recorded (3 of 3)]
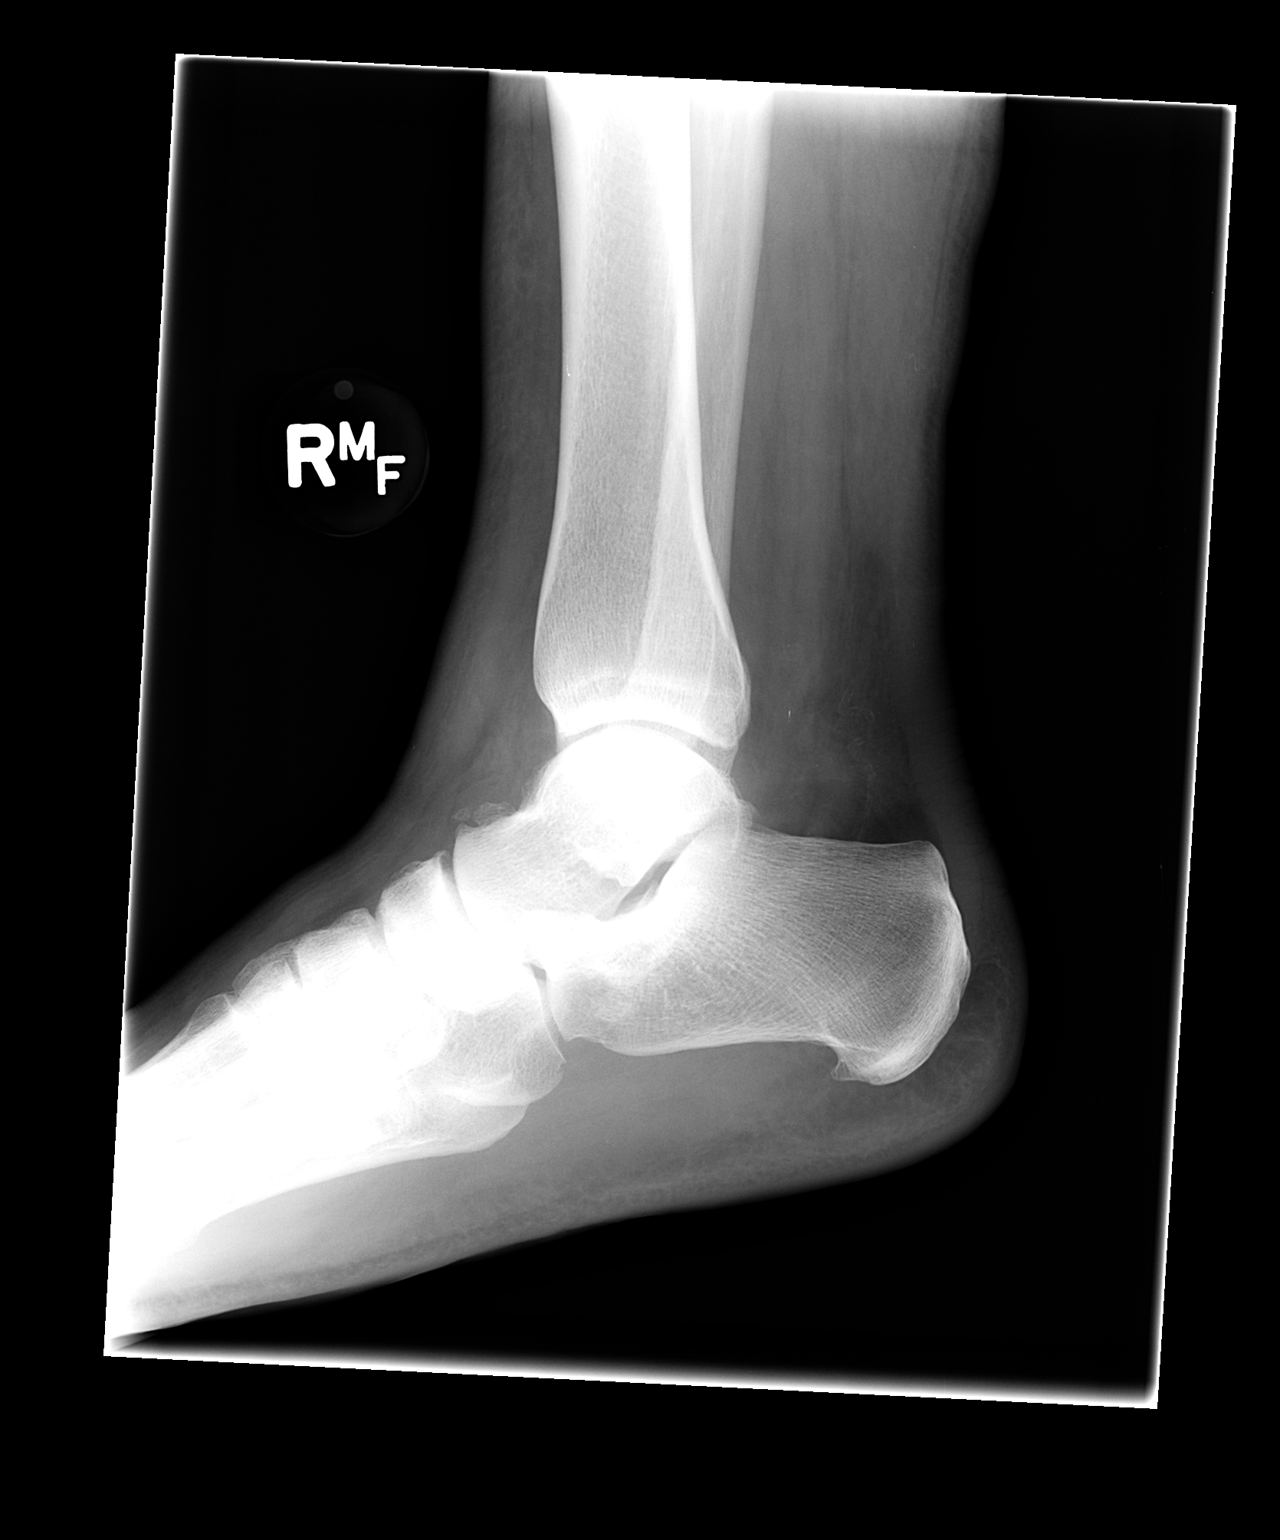

[3 of 3 positions shown; findings below may reference images not displayed]

FINDINGS: Oblique fracture of the fifth metatarsal   partially
seen. No other fracture evident.  Ankle mortise intact.  Normal
mineralization and alignment.  Small calcaneal spur.  Corticated
ossicles at the dorsal margin of the distal talus.
IMPRESSION: 1. Negative ankle.
2.  Fifth metatarsal fracture, incompletely visualized

## 2014-10-08 ENCOUNTER — Encounter: Payer: Self-pay | Admitting: Emergency Medicine

## 2014-10-08 ENCOUNTER — Emergency Department
Admission: EM | Admit: 2014-10-08 | Discharge: 2014-10-08 | Disposition: A | Discharge status: Home / Self Care | Payer: 59 | Source: Home / Self Care | Attending: Emergency Medicine | Admitting: Emergency Medicine

## 2014-10-08 DIAGNOSIS — J111 Influenza due to unidentified influenza virus with other respiratory manifestations: Secondary | ICD-10-CM | POA: Diagnosis not present

## 2014-10-08 DIAGNOSIS — R69 Illness, unspecified: Principal | ICD-10-CM

## 2014-10-08 DIAGNOSIS — R110 Nausea: Secondary | ICD-10-CM

## 2014-10-08 LAB — POCT INFLUENZA A/B
Influenza A, POC: NEGATIVE
Influenza B, POC: NEGATIVE

## 2014-10-08 MED ORDER — PROMETHAZINE HCL 25 MG/ML IJ SOLN
25.0000 mg | Freq: Once | INTRAMUSCULAR | Status: AC
  Administered 2014-10-08: 25 mg via INTRAMUSCULAR

## 2014-10-08 MED ORDER — OSELTAMIVIR PHOSPHATE 75 MG PO CAPS: ORAL_CAPSULE | ORAL | Status: AC

## 2014-10-08 MED ORDER — BENZONATATE 200 MG PO CAPS: ORAL_CAPSULE | ORAL | Status: AC

## 2014-10-08 NOTE — ED Notes (Signed)
[facility] c/o fever,body aches, and cough starting yesterday. States his coworker was dx with influenza x2 days ago.

## 2014-10-08 NOTE — ED Provider Notes (Signed)
CSN: [id number]     Arrival date & time 10/08/14  1035 History   First MD Initiated Contact with Patient 10/08/14 1038     Chief Complaint  Patient presents with  . Fever  . Cough   Here with wife HPI FLU  HPI : Acute onset of Flu symptoms for  1 day.  Coworker was diagnosed with influenza 2 days ago.  Fever to 102 with chills, sweats, myalgias, fatigue, headache. Symptoms are progressively worsening, despite trying OTC fever reducing medicine and rest and fluids. Has decreased appetite, but tolerating some liquids by mouth. Worsening nausea this morning, but no vomiting.  No history of recent tick bite.  Review of Systems: Positive for fatigue, mild nasal congestion, mild sore throat, mild swollen anterior neck glands, + nonproductive cough, which causes soreness in rib muscles when he coughs. Negative for acute vision changes, stiff neck, focal weakness, syncope, seizures, respiratory distress, vomiting, diarrhea, GU symptoms, new Rash.  Past Medical History  Diagnosis Date  . Hyperlipemia    Past Surgical History  Procedure Laterality Date  . Rotator cuff repair     History reviewed. No pertinent family history. History  Substance Use Topics  . Smoking status: Never Smoker   . Smokeless tobacco: Not on file  . Alcohol Use: No    Review of Systems  All other systems reviewed and are negative.   Allergies  Review of patient's allergies indicates no known allergies.  Home Medications   Prior to Admission medications   Medication Sig Start Date End Date Taking? Authorizing Provider  oseltamivir (TAMIFLU) 75 MG capsule Starting today, take 1 capsule by mouth twice a day for 5 days. 10/08/14   Lajean Manes, MD   BP 148/74 mmHg  Pulse 101  Temp(Src) 99.4 F (37.4 C) (Oral)  SpO2 95% Physical Exam  Constitutional: He appears well-developed and well-nourished.  Non-toxic appearance. He appears ill (very fatigued, but no cardiorespiratory distress). No distress.  HENT:   Head: Normocephalic and atraumatic.  Right Ear: Tympanic membrane and external ear normal.  Left Ear: Tympanic membrane and external ear normal.  Nose: Rhinorrhea present.  Mouth/Throat: Mucous membranes are normal. No oropharyngeal exudate, posterior oropharyngeal edema or posterior oropharyngeal erythema.  Eyes: Conjunctivae are normal. Right eye exhibits no discharge. Left eye exhibits no discharge. No scleral icterus.  Neck: Neck supple.  Cardiovascular: Normal rate, regular rhythm and normal heart sounds.   Pulmonary/Chest: Breath sounds normal. No stridor. No respiratory distress. He has no decreased breath sounds. He has no wheezes. He has no rhonchi. He has no rales.  Mild muscular tenderness to palpation upper abdominal wall muscles and bilateral intercostal muscles but no bony rib tenderness.  Abdominal: Soft. There is no hepatosplenomegaly. There is no tenderness. There is no rigidity, no guarding, no tenderness at McBurney's point and negative Murphy's sign.  Musculoskeletal: He exhibits no edema.  Lymphadenopathy:    He has cervical adenopathy (mild shoddy anterior cervical nodes).  Neurological: He is alert.  Skin: Skin is warm and intact. No rash noted. He is diaphoretic.  Psychiatric: He has a normal mood and affect.  Nursing note and vitals reviewed.   ED Course  Procedures (including critical care time) Labs Review Labs Reviewed  POCT INFLUENZA A/B    Imaging Review No results found.   MDM   1. Influenza-like illness   2. Nausea without vomiting    Although rapid flu test negative here in urgent care, still likely diagnosis of influenza or influenza-like illness.  Clinically, no evidence of pneumonia or acute abdomen. Pulse ox 95% on room air. Treatment options discussed, as well as risks, benefits, alternatives. They declined any other testing today. Patient and wife voiced understanding and agreement with the following plans: Tamiflu 75 twice a day 5  days Phenergan 25 mg IM stat. They have prescription at home for Phenergan from prior illness, to use when necessary nausea. They declined Zofran at this time. For cough, Tessalon Perles prescribed. Other symptomatic care discussed. Verbal and written instructions given regarding influenza. Follow-up with your primary care doctor in 4-5 days if not improving, or sooner if symptoms become worse. Precautions discussed. Red flags discussed. Questions invited and answered. They voiced understanding and agreement.      Lajean Manesavid Massey, MD 10/08/14 1131

## 2015-09-08 ENCOUNTER — Encounter: Payer: Self-pay | Admitting: Gastroenterology

## 2016-08-27 ENCOUNTER — Encounter (HOSPITAL_BASED_OUTPATIENT_CLINIC_OR_DEPARTMENT_OTHER): Payer: Self-pay | Admitting: Emergency Medicine

## 2016-08-27 ENCOUNTER — Emergency Department
Admission: EM | Admit: 2016-08-27 | Discharge: 2016-08-27 | Disposition: A | Discharge status: Home / Self Care | Payer: 59 | Source: Home / Self Care | Attending: Family Medicine | Admitting: Family Medicine

## 2016-08-27 ENCOUNTER — Encounter: Payer: Self-pay | Admitting: Emergency Medicine

## 2016-08-27 ENCOUNTER — Emergency Department (HOSPITAL_BASED_OUTPATIENT_CLINIC_OR_DEPARTMENT_OTHER)
Admission: EM | Admit: 2016-08-27 | Discharge: 2016-08-27 | Disposition: A | Discharge status: Home / Self Care | Payer: 59 | Attending: Emergency Medicine | Admitting: Emergency Medicine

## 2016-08-27 ENCOUNTER — Emergency Department (HOSPITAL_BASED_OUTPATIENT_CLINIC_OR_DEPARTMENT_OTHER): Payer: 59

## 2016-08-27 DIAGNOSIS — M25572 Pain in left ankle and joints of left foot: Secondary | ICD-10-CM | POA: Diagnosis not present

## 2016-08-27 DIAGNOSIS — Z23 Encounter for immunization: Secondary | ICD-10-CM | POA: Diagnosis not present

## 2016-08-27 DIAGNOSIS — M7989 Other specified soft tissue disorders: Secondary | ICD-10-CM | POA: Diagnosis present

## 2016-08-27 DIAGNOSIS — L03116 Cellulitis of left lower limb: Secondary | ICD-10-CM | POA: Diagnosis not present

## 2016-08-27 DIAGNOSIS — M79662 Pain in left lower leg: Secondary | ICD-10-CM

## 2016-08-27 HISTORY — DX: Essential (primary) hypertension: I10

## 2016-08-27 HISTORY — DX: Hyperlipidemia, unspecified: E78.5

## 2016-08-27 LAB — CBG MONITORING, ED: Glucose-Capillary: 95 mg/dL (ref 65–99)

## 2016-08-27 MED ORDER — TETANUS-DIPHTH-ACELL PERTUSSIS 5-2.5-18.5 LF-MCG/0.5 IM SUSP
0.5000 mL | Freq: Once | INTRAMUSCULAR | Status: AC
  Administered 2016-08-27: 0.5 mL via INTRAMUSCULAR
  Filled 2016-08-27: qty 0.5

## 2016-08-27 MED ORDER — VANCOMYCIN HCL IN DEXTROSE 1-5 GM/200ML-% IV SOLN
1000.0000 mg | Freq: Once | INTRAVENOUS | Status: AC
  Administered 2016-08-27: 1000 mg via INTRAVENOUS
  Filled 2016-08-27: qty 200

## 2016-08-27 MED ORDER — CLINDAMYCIN HCL 300 MG PO CAPS: 300.0000 mg | ORAL_CAPSULE | Freq: Four times a day (QID) | ORAL | 0 refills | Status: AC

## 2016-08-27 NOTE — ED Notes (Signed)
Patient transported to Ultrasound 

## 2016-08-27 NOTE — ED Triage Notes (Signed)
Patient c/o sudden onset left ankle pain. Seen at urgent care, sent here to ruleout DVT v/s septic ankle joint. Patient received 800mg  ibuprofen at @ 1600.

## 2016-08-27 NOTE — ED Triage Notes (Signed)
Patient reports sudden onset on pain in left ankle with edema and erythema 2 days ago; no injury or motion responsible; does have area of broke skin distal to sight that he treated for suspected athlete's fungal infection.

## 2016-08-27 NOTE — Discharge Instructions (Signed)
Proceed immediately [facility]

## 2016-08-27 NOTE — ED Provider Notes (Signed)
[facility]    CSN: [id number] Arrival date & time: 08/27/16  1549     History   Chief Complaint Chief Complaint  Patient presents with  . Joint Swelling    left ankle    HPI Timothy Wiggins is a 56 y.o. male.   Patient developed onset of pain in his left medial ankle two days ago without preceding trauma/injury.  The area has become increasingly swollen and painful, with localized redness and pain today extending into his left distal lower posterior leg.  He admits that he had a skin lesion on his left medial foot about one month ago that healed after using an anti-fungal cream.  He feels well otherwise.  No fevers, chills, and sweats.  No chest pain, cough, or shortness of breath. No past history of gout.   The history is provided by the patient.  Ankle Pain  Location:  Ankle and leg Time since incident:  2 days Injury: no   Leg location:  L lower leg Ankle location:  L ankle Pain details:    Quality:  Aching   Radiates to: left calf.   Severity:  Moderate   Onset quality:  Sudden   Duration:  2 days   Timing:  Constant   Progression:  Worsening Chronicity:  New Prior injury to area:  No Relieved by:  Nothing Worsened by:  Bearing weight Ineffective treatments:  NSAIDs Associated symptoms: decreased ROM, stiffness and swelling   Associated symptoms: no fatigue, no fever, no itching, no muscle weakness and no numbness     Past Medical History:  Diagnosis Date  . Hyperlipemia   . Hyperlipidemia    borederline  . Hypertension    borderline    Patient Active Problem List   Diagnosis Date Noted  . Nondisplaced fracture of fifth right metatarsal bone 01/07/2013  . KNEE PAIN 11/03/2010  . VARICOSE VEIN 06/08/2010  . FATIGUE 06/08/2010  . RASH-NONVESICULAR 06/08/2010  . LIBIDO, DECREASED 06/08/2010  . SNORING, HX OF 06/08/2010  . HYPERLIPIDEMIA 05/21/2007  . DISORDER, DYSMETABOLIC SYNDROME X 05/21/2007    Past Surgical History:  Procedure  Laterality Date  . CHOLECYSTECTOMY    . ROTATOR CUFF REPAIR         Home Medications    Prior to Admission medications   Medication Sig Start Date End Date Taking? Authorizing Provider  benzonatate (TESSALON) 200 MG capsule Take 1 every 8 hours as needed for cough. 10/08/14   Lajean Manesavid Massey, MD  oseltamivir (TAMIFLU) 75 MG capsule Starting today, take 1 capsule by mouth twice a day for 5 days. 10/08/14   Lajean Manesavid Massey, MD    Family History Family History  Problem Relation Age of Onset  . Heart failure Mother   . Hypertension Mother   . Hyperlipidemia Father     Social History Social History  Substance Use Topics  . Smoking status: Never Smoker  . Smokeless tobacco: Never Used  . Alcohol use No     Allergies   Patient has no known allergies.   Review of Systems Review of Systems  Constitutional: Negative for diaphoresis, fatigue and fever.  Respiratory: Positive for chest tightness. Negative for cough, shortness of breath, wheezing and stridor.   Cardiovascular: Positive for chest pain and leg swelling. Negative for palpitations.  Musculoskeletal: Positive for stiffness.  Skin: Positive for color change. Negative for itching.  All other systems reviewed and are negative.    Physical Exam Triage Vital Signs ED Triage Vitals  Enc Vitals  Group     BP 08/27/16 1629 151/92     Pulse Rate 08/27/16 1629 70     Resp 08/27/16 1629 16     Temp 08/27/16 1629 98.2 F (36.8 C)     Temp Source 08/27/16 1629 Oral     SpO2 08/27/16 1629 96 %     Weight 08/27/16 1629 285 lb (129.3 kg)     Height 08/27/16 1629 5\' 10"  (1.778 m)     Head Circumference --      Peak Flow --      Pain Score 08/27/16 1633 7     Pain Loc --      Pain Edu? --      Excl. in GC? --    No data found.   Updated Vital Signs BP 151/92 (BP Location: Left Arm)   Pulse 70   Temp 98.2 F (36.8 C) (Oral)   Resp 16   Ht 5\' 10"  (1.778 m)   Wt 285 lb (129.3 kg)   SpO2 96%   BMI 40.89 kg/m   Visual  Acuity Right Eye Distance:   Left Eye Distance:   Bilateral Distance:    Right Eye Near:   Left Eye Near:    Bilateral Near:     Physical Exam  Constitutional: He appears well-developed and well-nourished. No distress.  HENT:  Head: Normocephalic.  Mouth/Throat: Oropharynx is clear and moist.  Eyes: Conjunctivae are normal. Pupils are equal, round, and reactive to light.  Neck: Neck supple.  Cardiovascular: Normal heart sounds.   Pulmonary/Chest: Breath sounds normal.  Abdominal: There is no tenderness.  Musculoskeletal: He exhibits no edema.       Left ankle: He exhibits decreased range of motion and swelling. He exhibits no ecchymosis. Tenderness. Medial malleolus tenderness found.       Legs:      Feet:  There is diffuse swelling and tenderness left medial malleolus with an area of warmth/erythema just above the malleolus.  Tenderness extends into the left posterior calf as noted on diagram.  There is an area of eschar below the medial malleolus that could have served as nidus for bacterial entry.  Lymphadenopathy:    He has no cervical adenopathy.  Neurological: He is alert.  Skin: Skin is warm and dry.  Nursing note and vitals reviewed.    UC Treatments / Results  Labs (all labs ordered are listed, but only abnormal results are displayed) Labs Reviewed - No data to display  EKG  EKG Interpretation None       Radiology No results found.  Procedures Procedures (including critical care time)  Medications Ordered in UC Medications - No data to display   Initial Impression / Assessment and Plan / UC Course  I have reviewed the triage vital signs and the nursing notes.  Pertinent labs & imaging results that were available during my care of the patient were reviewed by me and considered in my medical decision making (see chart for details).    Further intervention not undertaken here.  Need to rule out ?DVT; ?cellulitis ankle; ?septic ankle joint; ?acute  gout. Advised to proceed to [facility] for further evaluation.    Final Clinical Impressions(s) / UC Diagnoses   Final diagnoses:  Acute left ankle pain  Pain in left lower leg    New Prescriptions New Prescriptions   No medications on file     Lattie Haw, MD 08/27/16 1749

## 2016-08-27 NOTE — ED Provider Notes (Signed)
[facility] Provider Note   CSN: [id number] Arrival date & time: 08/27/16  1824  By signing my name below, I, Rosario Adie, attest that this documentation has been prepared under the direction and in the presence of Rolan Bucco, MD. Electronically Signed: Rosario Adie, ED Scribe. 08/27/16. 8:52 PM.  History   Chief Complaint Chief Complaint  Patient presents with  . Leg Swelling    left ankle   The history is provided by the patient and medical records. No language interpreter was used.    HPI Comments: Nicholaos Schippers is a 56 y.o. male with a h/o HTN, who presents to the Emergency Department complaining of persistent, gradually worsening left ankle pain onset two days ago. He reports associated swelling to the ankle which began yesterday, and redness over the ankle which began this morning secondary to his ankle pain. He additionally notes that he has a mild area of swelling and redness just below his area of and which proceeded the onset of his ankle pain; however, he attributes this to athlete's foot and this has been improving with anti-fungal sprays. No preceding trauma or injury to the ankle otherwise. Per prior chart review, pt was seen at [facility] today prior to coming into the ED, and at that time he was referred into the ED for further management and to r/o DVT vs cellulitis vs possible septic joint vs acute gout. No h/o Gout, prior skin infections. He denies nausea, vomiting, fever, chills, joint swelling otherwise, chest pain, shortness of breath, or any other associated symptoms. Tetanus is not UTD.   Past Medical History:  Diagnosis Date  . Hyperlipemia   . Hyperlipidemia    borederline  . Hypertension    borderline   Patient Active Problem List   Diagnosis Date Noted  . Nondisplaced fracture of fifth right metatarsal bone 01/07/2013  . KNEE PAIN 11/03/2010  . VARICOSE VEIN 06/08/2010  . FATIGUE 06/08/2010  . RASH-NONVESICULAR 06/08/2010  .  LIBIDO, DECREASED 06/08/2010  . SNORING, HX OF 06/08/2010  . HYPERLIPIDEMIA 05/21/2007  . DISORDER, DYSMETABOLIC SYNDROME X 05/21/2007   Past Surgical History:  Procedure Laterality Date  . CHOLECYSTECTOMY    . ROTATOR CUFF REPAIR      Home Medications    Prior to Admission medications   Medication Sig Start Date End Date Taking? Authorizing Provider  benzonatate (TESSALON) 200 MG capsule Take 1 every 8 hours as needed for cough. 10/08/14   Lajean Manes, MD  clindamycin (CLEOCIN) 300 MG capsule Take 1 capsule (300 mg total) by mouth 4 (four) times daily. X 7 days 08/27/16   Rolan Bucco, MD  oseltamivir (TAMIFLU) 75 MG capsule Starting today, take 1 capsule by mouth twice a day for 5 days. 10/08/14   Lajean Manes, MD   Family History Family History  Problem Relation Age of Onset  . Heart failure Mother   . Hypertension Mother   . Hyperlipidemia Father    Social History Social History  Substance Use Topics  . Smoking status: Never Smoker  . Smokeless tobacco: Never Used  . Alcohol use No   Allergies   Patient has no known allergies.  Review of Systems Review of Systems  Constitutional: Negative for chills and fever.  Respiratory: Negative for shortness of breath.   Cardiovascular: Negative for chest pain.  Gastrointestinal: Negative for nausea and vomiting.  Musculoskeletal: Positive for arthralgias (left ankle) and joint swelling (left ankle).  Skin: Positive for color change (redness, left ankle) and wound.  All other systems reviewed and are negative.  Physical Exam Updated Vital Signs BP 148/94   Pulse 68   Temp 98.6 F (37 C) (Oral)   Resp 20   Ht 5\' 10"  (1.778 m)   Wt 285 lb (129.3 kg)   SpO2 98%   BMI 40.89 kg/m   Physical Exam  Constitutional: He is oriented to person, place, and time. He appears well-developed and well-nourished.  HENT:  Head: Normocephalic and atraumatic.  Neck: Normal range of motion. Neck supple.  Cardiovascular: Normal rate.     Pulmonary/Chest: Effort normal.  Musculoskeletal: He exhibits edema and tenderness.  Some swelling and erythema over the medial malleolus of the left ankle with TTP. There is some extension up to the mid-calf. No induration or fluctuance. No apparent joint involvement.   Neurological: He is alert and oriented to person, place, and time.  Skin: Skin is warm and dry. There is erythema.  Psychiatric: He has a normal mood and affect.      ED Treatments / Results  DIAGNOSTIC STUDIES: Oxygen Saturation is 100% on RA, normal by my interpretation.   COORDINATION OF CARE: 8:52 PM-Discussed next steps with pt including US of the LLE and IV abx. Pt verbalized understanding and is agreeable with the plan.   Labs (all labs ordered are listed, but only abnormal results are displayed) Labs Reviewed  CBG MONITORING, ED   EKG  EKG Interpretation None      Radiology [place] Venous Img Lower Unilateral Left  Result Date: 08/27/2016 CLINICAL DATA:  LEFT lower extremity swelling LEFT ankle pain and swelling. EXAM: LOWER EXTREMITY VENOUS DOPPLER ULTRASOUND TECHNIQUE: Gray-scale sonography with graded compression, as well as color Doppler and duplex ultrasound were performed to evaluate the lower extremity deep venous systems from the level of the common femoral vein and including the common femoral, femoral, profunda femoral, popliteal and calf veins including the posterior tibial, peroneal and gastrocnemius veins when visible. The superficial great saphenous vein was also interrogated. Spectral Doppler was utilized to evaluate flow at rest and with distal augmentation maneuvers in the common femoral, femoral and popliteal veins. COMPARISON:  None. FINDINGS: Contralateral Common Femoral Vein: Respiratory phasicity is normal and symmetric with the symptomatic side. No evidence of thrombus. Normal compressibility. Common Femoral Vein: No evidence of thrombus. Normal compressibility, respiratory phasicity and  response to augmentation. Saphenofemoral Junction: No evidence of thrombus. Normal compressibility and flow on color Doppler imaging. Profunda Femoral Vein: No evidence of thrombus. Normal compressibility and flow on color Doppler imaging. Femoral Vein: No evidence of thrombus. Normal compressibility, respiratory phasicity and response to augmentation. Popliteal Vein: No evidence of thrombus. Normal compressibility, respiratory phasicity and response to augmentation. Calf Veins: No evidence of thrombus. Normal compressibility and flow on color Doppler imaging. Superficial Great Saphenous Vein: No evidence of thrombus. Normal compressibility and flow on color Doppler imaging. \ Other Findings: There is subcutaneous irregular echogenic heterogeneous collection measuring 4 cm x 1 cm at the medial LEFT ankle suggesting edema or hematoma IMPRESSION: No evidence of LEFT lower extremity deep venous thrombosis. LEFT ankle hematoma or edema. Electronically Signed   By: Genevive BiStewart  Edmunds M.D.   On: 08/27/2016 20:02   Procedures Procedures   Medications Ordered in ED Medications  vancomycin (VANCOCIN) IVPB 1000 mg/200 mL premix (0 mg Intravenous Stopped 08/27/16 2251)  Tdap (BOOSTRIX) injection 0.5 mL (0.5 mLs Intramuscular Given 08/27/16 2136)    Initial Impression / Assessment and Plan / ED Course  I have reviewed the triage  vital signs and the nursing notes.  Pertinent labs & imaging results that were available during my care of the patient were reviewed by me and considered in my medical decision making (see chart for details).     Patient presents with swelling of his left lower extremity. There is no evidence of DVT. His symptoms are consistent with cellulitis. I don't appreciate any underlying abscess. I don't appreciate any evidence of joint involvement. It is localized to the medial aspect of his lower leg. There is no palpable joint effusion or pain on range of motion of the joint. He was given dose of  IV antibiotics in the ED. He was advised to keep the leg elevated. He was started on clindamycin. His tetanus shot was updated. He has a follow-up appointment with his PCP in 2 days. I advised him to return if he has any worsening symptoms including redness that has spread past the marked borders, fevers or other worsening symptoms.  Final Clinical Impressions(s) / ED Diagnoses   Final diagnoses:  Cellulitis of left lower extremity   New Prescriptions New Prescriptions   CLINDAMYCIN (CLEOCIN) 300 MG CAPSULE    Take 1 capsule (300 mg total) by mouth 4 (four) times daily. X 7 days   I personally performed the services described in this documentation, which was scribed in my presence.  The recorded information has been reviewed and considered.     Rolan Bucco, MD 08/27/16 2258

## 2016-08-31 ENCOUNTER — Telehealth: Payer: Self-pay | Admitting: Emergency Medicine

## 2016-08-31 NOTE — Telephone Encounter (Signed)
[facility] the cellulitis of his ankle.

## 2019-09-10 ENCOUNTER — Encounter: Payer: Self-pay | Admitting: Podiatry

## 2020-09-07 DEATH — deceased
